# Patient Record
Sex: Female | Born: 1965 | Race: White | Hispanic: No | Marital: Married | Smoking: Current every day smoker
Health system: Southern US, Community
[De-identification: ages and names within clinical notes are randomized; demographics above are authoritative.]

## PROBLEM LIST (undated history)

## (undated) DIAGNOSIS — J449 Chronic obstructive pulmonary disease, unspecified: Secondary | ICD-10-CM

## (undated) DIAGNOSIS — F419 Anxiety disorder, unspecified: Secondary | ICD-10-CM

## (undated) DIAGNOSIS — L400 Psoriasis vulgaris: Secondary | ICD-10-CM

## (undated) DIAGNOSIS — L4050 Arthropathic psoriasis, unspecified: Secondary | ICD-10-CM

## (undated) DIAGNOSIS — F988 Other specified behavioral and emotional disorders with onset usually occurring in childhood and adolescence: Secondary | ICD-10-CM

## (undated) DIAGNOSIS — I10 Essential (primary) hypertension: Secondary | ICD-10-CM

## (undated) DIAGNOSIS — A0472 Enterocolitis due to Clostridium difficile, not specified as recurrent: Secondary | ICD-10-CM

## (undated) DIAGNOSIS — K219 Gastro-esophageal reflux disease without esophagitis: Secondary | ICD-10-CM

## (undated) HISTORY — PX: CERVICAL FUSION: SHX112

## (undated) HISTORY — DX: Arthropathic psoriasis, unspecified: L40.50

## (undated) HISTORY — PX: TUBAL LIGATION: SHX77

## (undated) HISTORY — PX: ELBOW ARTHROSCOPY WITH TENDON RECONSTRUCTION: SHX5616

## (undated) HISTORY — DX: Essential (primary) hypertension: I10

## (undated) HISTORY — PX: TRIGGER FINGER RELEASE: SHX641

## (undated) HISTORY — DX: Enterocolitis due to Clostridium difficile, not specified as recurrent: A04.72

## (undated) HISTORY — DX: Gastro-esophageal reflux disease without esophagitis: K21.9

## (undated) HISTORY — DX: Psoriasis vulgaris: L40.0

---

## 2000-03-20 ENCOUNTER — Encounter: Payer: Self-pay | Admitting: *Deleted

## 2000-03-20 ENCOUNTER — Encounter: Admission: RE | Admit: 2000-03-20 | Discharge: 2000-03-20 | Payer: Self-pay | Admitting: *Deleted

## 2000-07-10 ENCOUNTER — Ambulatory Visit (HOSPITAL_BASED_OUTPATIENT_CLINIC_OR_DEPARTMENT_OTHER): Admission: RE | Admit: 2000-07-10 | Discharge: 2000-07-10 | Payer: Self-pay | Admitting: Orthopedic Surgery

## 2000-10-17 ENCOUNTER — Other Ambulatory Visit: Admission: RE | Admit: 2000-10-17 | Discharge: 2000-10-17 | Payer: Self-pay | Admitting: Family Medicine

## 2001-03-26 ENCOUNTER — Ambulatory Visit (HOSPITAL_BASED_OUTPATIENT_CLINIC_OR_DEPARTMENT_OTHER): Admission: RE | Admit: 2001-03-26 | Discharge: 2001-03-26 | Payer: Self-pay | Admitting: Orthopedic Surgery

## 2003-04-05 ENCOUNTER — Other Ambulatory Visit: Admission: RE | Admit: 2003-04-05 | Discharge: 2003-04-05 | Payer: Self-pay | Admitting: Obstetrics & Gynecology

## 2012-06-11 ENCOUNTER — Encounter (INDEPENDENT_AMBULATORY_CARE_PROVIDER_SITE_OTHER): Payer: Self-pay

## 2013-03-15 ENCOUNTER — Emergency Department (HOSPITAL_COMMUNITY): Payer: Medicare Other

## 2013-03-15 ENCOUNTER — Inpatient Hospital Stay (HOSPITAL_COMMUNITY)
Admission: EM | Admit: 2013-03-15 | Discharge: 2013-03-17 | DRG: 372 | Disposition: A | Discharge status: Home / Self Care | Payer: Medicare Other | Attending: Internal Medicine | Admitting: Internal Medicine

## 2013-03-15 ENCOUNTER — Encounter (HOSPITAL_COMMUNITY): Payer: Self-pay | Admitting: Emergency Medicine

## 2013-03-15 DIAGNOSIS — F17200 Nicotine dependence, unspecified, uncomplicated: Secondary | ICD-10-CM | POA: Diagnosis present

## 2013-03-15 DIAGNOSIS — IMO0002 Reserved for concepts with insufficient information to code with codable children: Secondary | ICD-10-CM

## 2013-03-15 DIAGNOSIS — A0472 Enterocolitis due to Clostridium difficile, not specified as recurrent: Principal | ICD-10-CM | POA: Diagnosis present

## 2013-03-15 DIAGNOSIS — D72829 Elevated white blood cell count, unspecified: Secondary | ICD-10-CM | POA: Diagnosis present

## 2013-03-15 DIAGNOSIS — F1920 Other psychoactive substance dependence, uncomplicated: Secondary | ICD-10-CM | POA: Diagnosis present

## 2013-03-15 DIAGNOSIS — F411 Generalized anxiety disorder: Secondary | ICD-10-CM | POA: Diagnosis present

## 2013-03-15 DIAGNOSIS — F410 Panic disorder [episodic paroxysmal anxiety] without agoraphobia: Secondary | ICD-10-CM | POA: Diagnosis present

## 2013-03-15 DIAGNOSIS — K529 Noninfective gastroenteritis and colitis, unspecified: Secondary | ICD-10-CM

## 2013-03-15 DIAGNOSIS — E440 Moderate protein-calorie malnutrition: Secondary | ICD-10-CM | POA: Diagnosis present

## 2013-03-15 DIAGNOSIS — J4489 Other specified chronic obstructive pulmonary disease: Secondary | ICD-10-CM | POA: Diagnosis present

## 2013-03-15 DIAGNOSIS — Z79899 Other long term (current) drug therapy: Secondary | ICD-10-CM

## 2013-03-15 DIAGNOSIS — J449 Chronic obstructive pulmonary disease, unspecified: Secondary | ICD-10-CM | POA: Diagnosis present

## 2013-03-15 DIAGNOSIS — D509 Iron deficiency anemia, unspecified: Secondary | ICD-10-CM | POA: Diagnosis present

## 2013-03-15 DIAGNOSIS — Z8249 Family history of ischemic heart disease and other diseases of the circulatory system: Secondary | ICD-10-CM

## 2013-03-15 HISTORY — DX: Anxiety disorder, unspecified: F41.9

## 2013-03-15 HISTORY — DX: Other specified behavioral and emotional disorders with onset usually occurring in childhood and adolescence: F98.8

## 2013-03-15 HISTORY — DX: Chronic obstructive pulmonary disease, unspecified: J44.9

## 2013-03-15 LAB — COMPREHENSIVE METABOLIC PANEL
ALT: 14 U/L (ref 0–35)
AST: 21 U/L (ref 0–37)
Albumin: 3.3 g/dL — ABNORMAL LOW (ref 3.5–5.2)
Alkaline Phosphatase: 72 U/L (ref 39–117)
BUN: 10 mg/dL (ref 6–23)
CO2: 22 mEq/L (ref 19–32)
Calcium: 8.7 mg/dL (ref 8.4–10.5)
Chloride: 101 mEq/L (ref 96–112)
Creatinine, Ser: 0.72 mg/dL (ref 0.50–1.10)
GFR calc Af Amer: >90 mL/min (ref >90)
GFR calc non Af Amer: >90 mL/min (ref >90)
Glucose, Bld: 103 mg/dL — ABNORMAL HIGH (ref 70–99)
Potassium: 3.8 mEq/L (ref 3.5–5.1)
Sodium: 135 mEq/L (ref 135–145)
Total Bilirubin: 0.4 mg/dL (ref 0.3–1.2)
Total Protein: 7.1 g/dL (ref 6.0–8.3)

## 2013-03-15 LAB — URINALYSIS, ROUTINE W REFLEX MICROSCOPIC
Bilirubin Urine: NEGATIVE
Glucose, UA: NEGATIVE mg/dL
Hgb urine dipstick: NEGATIVE
Ketones, ur: 40 mg/dL — AB
Leukocytes, UA: NEGATIVE
Nitrite: NEGATIVE
Protein, ur: NEGATIVE mg/dL
Specific Gravity, Urine: 1.007 (ref 1.005–1.030)
Urobilinogen, UA: 0.2 mg/dL (ref 0.0–1.0)
pH: 5.5 (ref 5.0–8.0)

## 2013-03-15 LAB — CBC WITH DIFFERENTIAL/PLATELET
Basophils Absolute: 0 10*3/uL (ref 0.0–0.1)
Basophils Relative: 0 % (ref 0–1)
Eosinophils Absolute: 0 10*3/uL (ref 0.0–0.7)
Eosinophils Relative: 0 % (ref 0–5)
HCT: 44.6 % (ref 36.0–46.0)
Hemoglobin: 15.9 g/dL — ABNORMAL HIGH (ref 12.0–15.0)
Lymphocytes Relative: 6 % — ABNORMAL LOW (ref 12–46)
Lymphs Abs: 1 10*3/uL (ref 0.7–4.0)
MCH: 31.2 pg (ref 26.0–34.0)
MCHC: 35.7 g/dL (ref 30.0–36.0)
MCV: 87.5 fL (ref 78.0–100.0)
Monocytes Absolute: 0.9 10*3/uL (ref 0.1–1.0)
Monocytes Relative: 6 % (ref 3–12)
Neutro Abs: 13.8 10*3/uL — ABNORMAL HIGH (ref 1.7–7.7)
Neutrophils Relative %: 88 % — ABNORMAL HIGH (ref 43–77)
Platelets: 195 10*3/uL (ref 150–400)
RBC: 5.1 MIL/uL (ref 3.87–5.11)
RDW: 13.3 % (ref 11.5–15.5)
WBC: 15.7 10*3/uL — ABNORMAL HIGH (ref 4.0–10.5)

## 2013-03-15 LAB — PREGNANCY, URINE: Preg Test, Ur: NEGATIVE

## 2013-03-15 LAB — LIPASE, BLOOD: Lipase: 10 U/L — ABNORMAL LOW (ref 11–59)

## 2013-03-15 MED ORDER — MORPHINE SULFATE 4 MG/ML IJ SOLN
4.0000 mg | Freq: Once | INTRAMUSCULAR | Status: AC
  Administered 2013-03-15: 4 mg via INTRAVENOUS
  Filled 2013-03-15: qty 1

## 2013-03-15 MED ORDER — METRONIDAZOLE IN NACL 5-0.79 MG/ML-% IV SOLN
500.0000 mg | Freq: Once | INTRAVENOUS | Status: AC
  Administered 2013-03-16: 500 mg via INTRAVENOUS
  Filled 2013-03-15: qty 100

## 2013-03-15 MED ORDER — ONDANSETRON HCL 4 MG/2ML IJ SOLN
4.0000 mg | Freq: Once | INTRAMUSCULAR | Status: AC
  Administered 2013-03-15: 4 mg via INTRAVENOUS
  Filled 2013-03-15: qty 2

## 2013-03-15 MED ORDER — SODIUM CHLORIDE 0.9 % IV BOLUS (SEPSIS)
1000.0000 mL | Freq: Once | INTRAVENOUS | Status: AC
  Administered 2013-03-16: 1000 mL via INTRAVENOUS

## 2013-03-15 MED ORDER — IOHEXOL 300 MG/ML  SOLN
100.0000 mL | Freq: Once | INTRAMUSCULAR | Status: AC | PRN
  Administered 2013-03-15: 100 mL via INTRAVENOUS

## 2013-03-15 NOTE — ED Notes (Signed)
Pt's husband came to desk to discuss wait time.  He stated that he would like a way to contact customer service because of thought since his wife's PCP called and talked to staff the wait would be less.  Explanation given that the staff was working as fast as possible to get her back and that she would be seen as soon as possible.

## 2013-03-15 NOTE — ED Notes (Signed)
MD at bedside. 

## 2013-03-15 NOTE — ED Provider Notes (Signed)
CSN: [id number]     Arrival date & time 03/15/13  1557 History   First MD Initiated Contact with Patient 03/15/13 2018     Chief Complaint  Patient presents with  . Abdominal Pain  . Diarrhea   (Consider location/radiation/quality/duration/timing/severity/associated sxs/prior Treatment) Patient is a 47 y.o. female presenting with abdominal pain and diarrhea. The history is provided by the patient.  Abdominal Pain Associated symptoms: diarrhea   Associated symptoms: no chest pain, no nausea, no shortness of breath and no vomiting   Diarrhea Associated symptoms: abdominal pain   Associated symptoms: no headaches and no vomiting    patient presents with lower abdominal pain. She was sent in by an urgent care. She states she's had diarrhea for a couple months. It began after using antibiotics for dental surgery. No fevers. She states the diarrhea is like water. No fevers. She states sometimes she has some difficulty urinating. No dysuria.  the pain is dull and crampy. She states that she has lost weight. She has lost close to 30 pounds.  Past Medical History  Diagnosis Date  . COPD (chronic obstructive pulmonary disease)   . ADD (attention deficit disorder)   . Anxiety disorder    Past Surgical History  Procedure Laterality Date  . Hand tendon surgery    . Cesarean section    . Back surgery    . Tubal ligation     Family History  Problem Relation Age of Onset  . CAD Father   . Throat cancer Other    History  Substance Use Topics  . Smoking status: Current Every Day Smoker -- 0.50 packs/day for 37 years    Types: Cigarettes  . Smokeless tobacco: Not on file  . Alcohol Use: Yes     Comment: occ   OB History   Grav Para Term Preterm Abortions TAB SAB Ect Mult Living                 Review of Systems  Constitutional: Positive for appetite change. Negative for activity change.  Eyes: Negative for pain.  Respiratory: Negative for chest tightness and shortness of breath.    Cardiovascular: Negative for chest pain and leg swelling.  Gastrointestinal: Positive for abdominal pain and diarrhea. Negative for nausea and vomiting.  Genitourinary: Negative for flank pain.  Musculoskeletal: Negative for back pain and neck stiffness.  Skin: Negative for rash.  Neurological: Negative for weakness, numbness and headaches.  Psychiatric/Behavioral: Negative for behavioral problems.    Allergies  Review of patient's allergies indicates no known allergies.  Home Medications   Current Outpatient Rx  Name  Route  Sig  Dispense  Refill  . ALPRAZolam (XANAX) 1 MG tablet   Oral   Take 1 mg by mouth 4 (four) times daily as needed for anxiety (panic attacks).         Marland Kitchen amphetamine-dextroamphetamine (ADDERALL) 20 MG tablet   Oral   Take 20 mg by mouth 3 (three) times daily.         . ClonazePAM (KLONOPIN PO)   Oral   Take 1 tablet by mouth once.         . diphenhydrAMINE (BENADRYL) 25 MG tablet   Oral   Take 25 mg by mouth daily as needed for allergies.         . Fe Fum-FA-B Cmp-C-Zn-Mg-Mn-Cu (FERROCITE PLUS) 106-1 MG TABS   Oral   Take 1 tablet by mouth daily.         Marland Kitchen ibuprofen (  ADVIL,MOTRIN) 800 MG tablet   Oral   Take 800 mg by mouth every 8 (eight) hours as needed (pain).         Marland Kitchen oxyCODONE-acetaminophen (PERCOCET) 10-325 MG per tablet   Oral   Take 1 tablet by mouth every 4 (four) hours as needed for pain.         . traMADol (ULTRAM) 50 MG tablet   Oral   Take 50 mg by mouth every 8 (eight) hours as needed (pain).         . triamcinolone cream (KENALOG) 0.1 %   Topical   Apply 1 application topically daily as needed (itching).         . zolpidem (AMBIEN) 10 MG tablet   Oral   Take 10 mg by mouth at bedtime as needed for sleep.         . metroNIDAZOLE (FLAGYL) 500 MG tablet   Oral   Take 1 tablet (500 mg total) by mouth every 8 (eight) hours.   39 tablet   0   . ondansetron (ZOFRAN) 4 MG tablet   Oral   Take 1 tablet  (4 mg total) by mouth daily as needed for nausea or vomiting.   20 tablet   0    BP 125/67  Pulse 98  Temp(Src) 98.2 F (36.8 C) (Oral)  Resp 21  Ht 5\' 1"  (1.549 m)  Wt 112 lb 3.2 oz (50.894 kg)  BMI 21.21 kg/m2  SpO2 97%  LMP 02/15/2013 Physical Exam  Nursing note and vitals reviewed. Constitutional: She is oriented to person, place, and time. She appears well-developed and well-nourished.  HENT:  Head: Normocephalic and atraumatic.  Eyes: EOM are normal. Pupils are equal, round, and reactive to light.  Neck: Normal range of motion. Neck supple.  Cardiovascular: Normal rate, regular rhythm and normal heart sounds.   No murmur heard. Pulmonary/Chest: Effort normal and breath sounds normal. No respiratory distress. She has no wheezes. She has no rales.  Abdominal: Soft. Bowel sounds are normal. She exhibits no distension. There is tenderness. There is no rebound and no guarding.  Mild diffuse tenderness, worse in the right lower quadrant. No rebound or guarding.  Musculoskeletal: Normal range of motion.  Neurological: She is alert and oriented to person, place, and time. No cranial nerve deficit.  Skin: Skin is warm and dry.  Psychiatric: She has a normal mood and affect. Her speech is normal.    ED Course  Procedures (including critical care time) Labs Review Labs Reviewed  CLOSTRIDIUM DIFFICILE BY PCR - Abnormal; Notable for the following:    C difficile by pcr POSITIVE (*)    All other components within normal limits  CBC WITH DIFFERENTIAL - Abnormal; Notable for the following:    WBC 15.7 (*)    Hemoglobin 15.9 (*)    Neutrophils Relative % 88 (*)    Neutro Abs 13.8 (*)    Lymphocytes Relative 6 (*)    All other components within normal limits  COMPREHENSIVE METABOLIC PANEL - Abnormal; Notable for the following:    Glucose, Bld 103 (*)    Albumin 3.3 (*)    All other components within normal limits  URINALYSIS, ROUTINE W REFLEX MICROSCOPIC - Abnormal; Notable for  the following:    APPearance CLOUDY (*)    Ketones, ur 40 (*)    All other components within normal limits  LIPASE, BLOOD - Abnormal; Notable for the following:    Lipase 10 (*)  All other components within normal limits  BASIC METABOLIC PANEL - Abnormal; Notable for the following:    Calcium 8.1 (*)    All other components within normal limits  CBC - Abnormal; Notable for the following:    WBC 12.1 (*)    All other components within normal limits  STOOL CULTURE  GI PATHOGEN PANEL BY PCR, STOOL  PREGNANCY, URINE   Imaging Review No results found.  EKG Interpretation     Ventricular Rate:    PR Interval:    QRS Duration:   QT Interval:    QTC Calculation:   R Axis:     Text Interpretation:              MDM   1. Colitis   2. Anxiety state, unspecified    Patient with colitis. Has had diarrhea with weight loss. CT scan shows colitis. Has been on antibiotics. Will admit.    Juliet Rude. Rubin Payor, MD 03/18/13 [phone]

## 2013-03-15 NOTE — ED Notes (Signed)
Pt sent here by [facility] for eval of generalized abd pain with diarrhea and nausea

## 2013-03-15 NOTE — ED Notes (Signed)
Bedside commode at [facility] for a stool sample.

## 2013-03-15 NOTE — ED Notes (Signed)
CT called and informed the pt has finished her contrast drink.

## 2013-03-15 NOTE — ED Notes (Signed)
Pt denies any signs of fever. Pt states she was seen at urgent care and was sent here to be further evaluated.

## 2013-03-16 ENCOUNTER — Encounter (HOSPITAL_COMMUNITY): Payer: Self-pay | Admitting: Internal Medicine

## 2013-03-16 DIAGNOSIS — K5289 Other specified noninfective gastroenteritis and colitis: Secondary | ICD-10-CM

## 2013-03-16 DIAGNOSIS — E440 Moderate protein-calorie malnutrition: Secondary | ICD-10-CM | POA: Insufficient documentation

## 2013-03-16 DIAGNOSIS — K529 Noninfective gastroenteritis and colitis, unspecified: Secondary | ICD-10-CM

## 2013-03-16 LAB — GI PATHOGEN PANEL BY PCR, STOOL
C difficile toxin A/B: NEGATIVE
Cryptosporidium by PCR: NEGATIVE
E coli (ETEC) LT/ST: NEGATIVE
E coli (STEC): NEGATIVE
E coli 0157 by PCR: NEGATIVE
Shigella by PCR: NEGATIVE

## 2013-03-16 LAB — CBC
Hemoglobin: 12.5 g/dL (ref 12.0–15.0)
MCHC: 34 g/dL (ref 30.0–36.0)
RBC: 4.17 MIL/uL (ref 3.87–5.11)
WBC: 12.1 10*3/uL — ABNORMAL HIGH (ref 4.0–10.5)

## 2013-03-16 LAB — BASIC METABOLIC PANEL
BUN: 7 mg/dL (ref 6–23)
GFR calc Af Amer: >90 mL/min (ref >90)
GFR calc non Af Amer: >90 mL/min (ref >90)
Potassium: 3.5 mEq/L (ref 3.5–5.1)
Sodium: 137 mEq/L (ref 135–145)

## 2013-03-16 MED ORDER — MORPHINE SULFATE 2 MG/ML IJ SOLN
2.0000 mg | INTRAMUSCULAR | Status: DC | PRN
  Administered 2013-03-16: 2 mg via INTRAVENOUS
  Filled 2013-03-16: qty 1

## 2013-03-16 MED ORDER — METRONIDAZOLE 500 MG PO TABS
500.0000 mg | ORAL_TABLET | Freq: Three times a day (TID) | ORAL | Status: DC
  Administered 2013-03-16 – 2013-03-17 (×4): 500 mg via ORAL
  Filled 2013-03-16 (×6): qty 1

## 2013-03-16 MED ORDER — OXYCODONE-ACETAMINOPHEN 5-325 MG PO TABS
1.0000 | ORAL_TABLET | ORAL | Status: DC | PRN
  Administered 2013-03-16 (×2): 1 via ORAL
  Filled 2013-03-16 (×2): qty 1

## 2013-03-16 MED ORDER — ALPRAZOLAM 0.5 MG PO TABS
1.0000 mg | ORAL_TABLET | Freq: Four times a day (QID) | ORAL | Status: DC | PRN
  Administered 2013-03-16 – 2013-03-17 (×5): 1 mg via ORAL
  Filled 2013-03-16 (×5): qty 2

## 2013-03-16 MED ORDER — ONDANSETRON HCL 4 MG/2ML IJ SOLN
4.0000 mg | Freq: Four times a day (QID) | INTRAMUSCULAR | Status: DC | PRN
  Filled 2013-03-16: qty 2

## 2013-03-16 MED ORDER — MORPHINE SULFATE 2 MG/ML IJ SOLN: 2.0000 mg | INTRAMUSCULAR | Status: DC | PRN

## 2013-03-16 MED ORDER — OXYCODONE HCL 5 MG PO TABS
5.0000 mg | ORAL_TABLET | ORAL | Status: DC
  Administered 2013-03-16: 5 mg via ORAL
  Filled 2013-03-16: qty 1

## 2013-03-16 MED ORDER — OXYCODONE-ACETAMINOPHEN 10-325 MG PO TABS: 1.0000 | ORAL_TABLET | ORAL | Status: DC | PRN

## 2013-03-16 MED ORDER — SODIUM CHLORIDE 0.9 % IV SOLN
INTRAVENOUS | Status: AC
  Administered 2013-03-16 – 2013-03-17 (×3): via INTRAVENOUS

## 2013-03-16 MED ORDER — ZOLPIDEM TARTRATE 5 MG PO TABS
5.0000 mg | ORAL_TABLET | Freq: Every evening | ORAL | Status: DC | PRN
  Administered 2013-03-16 (×2): 5 mg via ORAL
  Filled 2013-03-16 (×2): qty 1

## 2013-03-16 MED ORDER — OXYCODONE HCL 5 MG PO TABS
5.0000 mg | ORAL_TABLET | ORAL | Status: DC | PRN
  Administered 2013-03-16 (×2): 5 mg via ORAL
  Filled 2013-03-16 (×2): qty 1

## 2013-03-16 MED ORDER — OXYCODONE-ACETAMINOPHEN 5-325 MG PO TABS
1.0000 | ORAL_TABLET | ORAL | Status: DC | PRN
  Administered 2013-03-16 – 2013-03-17 (×5): 1 via ORAL
  Filled 2013-03-16 (×4): qty 1

## 2013-03-16 MED ORDER — MORPHINE SULFATE 2 MG/ML IJ SOLN
1.0000 mg | INTRAMUSCULAR | Status: DC | PRN
  Administered 2013-03-16: 1 mg via INTRAVENOUS
  Filled 2013-03-16: qty 1

## 2013-03-16 MED ORDER — OXYCODONE HCL 5 MG PO TABS
5.0000 mg | ORAL_TABLET | Freq: Once | ORAL | Status: AC
  Administered 2013-03-16: 5 mg via ORAL
  Filled 2013-03-16: qty 1

## 2013-03-16 MED ORDER — MORPHINE SULFATE 4 MG/ML IJ SOLN
4.0000 mg | Freq: Once | INTRAMUSCULAR | Status: AC
  Administered 2013-03-16: 4 mg via INTRAVENOUS
  Filled 2013-03-16: qty 1

## 2013-03-16 MED ORDER — AMPHETAMINE-DEXTROAMPHETAMINE 10 MG PO TABS
20.0000 mg | ORAL_TABLET | Freq: Three times a day (TID) | ORAL | Status: DC
  Administered 2013-03-16 – 2013-03-17 (×3): 20 mg via ORAL
  Filled 2013-03-16 (×3): qty 2

## 2013-03-16 MED ORDER — OXYCODONE HCL 5 MG PO TABS
5.0000 mg | ORAL_TABLET | ORAL | Status: DC | PRN
  Administered 2013-03-16 – 2013-03-17 (×5): 5 mg via ORAL
  Filled 2013-03-16 (×5): qty 1

## 2013-03-16 MED ORDER — METRONIDAZOLE IN NACL 5-0.79 MG/ML-% IV SOLN
500.0000 mg | Freq: Three times a day (TID) | INTRAVENOUS | Status: DC
  Administered 2013-03-16: 500 mg via INTRAVENOUS
  Filled 2013-03-16 (×2): qty 100

## 2013-03-16 MED ORDER — AMPHETAMINE-DEXTROAMPHETAMINE 20 MG PO TABS: 20.0000 mg | ORAL_TABLET | Freq: Three times a day (TID) | ORAL | Status: DC

## 2013-03-16 MED ORDER — DICYCLOMINE HCL 10 MG/ML IM SOLN
20.0000 mg | Freq: Once | INTRAMUSCULAR | Status: DC
  Filled 2013-03-16: qty 2

## 2013-03-16 MED ORDER — ACETAMINOPHEN 650 MG RE SUPP: 650.0000 mg | Freq: Four times a day (QID) | RECTAL | Status: DC | PRN

## 2013-03-16 MED ORDER — OXYCODONE-ACETAMINOPHEN 5-325 MG PO TABS
1.0000 | ORAL_TABLET | ORAL | Status: DC
  Administered 2013-03-16: 1 via ORAL
  Filled 2013-03-16: qty 1

## 2013-03-16 MED ORDER — DICYCLOMINE HCL 20 MG PO TABS
20.0000 mg | ORAL_TABLET | Freq: Once | ORAL | Status: AC
  Administered 2013-03-16: 20 mg via ORAL
  Filled 2013-03-16: qty 1

## 2013-03-16 MED ORDER — ONDANSETRON HCL 4 MG PO TABS
4.0000 mg | ORAL_TABLET | Freq: Four times a day (QID) | ORAL | Status: DC | PRN
  Administered 2013-03-16 – 2013-03-17 (×3): 4 mg via ORAL
  Filled 2013-03-16 (×3): qty 1

## 2013-03-16 MED ORDER — ACETAMINOPHEN 325 MG PO TABS: 650.0000 mg | ORAL_TABLET | Freq: Four times a day (QID) | ORAL | Status: DC | PRN

## 2013-03-16 MED ORDER — FERROUS FUMARATE 325 (106 FE) MG PO TABS
1.0000 | ORAL_TABLET | Freq: Every day | ORAL | Status: DC
  Administered 2013-03-16 – 2013-03-17 (×2): 106 mg via ORAL
  Filled 2013-03-16 (×3): qty 1

## 2013-03-16 NOTE — Progress Notes (Addendum)
[facility] PROGRESS NOTE   Ann Ramsey UUV:[id number] DOB: 1966/02/07 DOA: 03/15/2013 PCP: Salvadore Dom, MD  Assessment/Plan  C. Diff colitis, borderline WBC count for severe, but already clinically improving on empiric flagyl.  Tolerating some food and drink -  Change to oral flagyl TID -  Advance diet  Narcotic dependence due to surgery many years ago, pain score down to 2 -  Tolerating diet so continue oral narcotics -  D/c IV pain medication as pain improving  Anxiety disorder with panic attacks.  States she has never been on controlled medications and only has been on xanax previously.  States her anxiety is completely unrelated to her adderall use.    ADD:  States she needs the adderall to help her focus.    COPD, stable.   Leukocytosis, trending down with hydration and antibiotics  Normocytic anemia, likely secondary to marrow suppression from acute illness and iron deficiency.  Repeat by PCP in 2-3 weeks.  Continued iron  This morning when I spoke with the patient we discussed primarily her diarrhea and did not discuss her chronic medical conditions or her medications.  This afternoon, I began trying to identify the medical problems for which she takes chronic narcotics, chronic xanax, and adderall.  I attempted to call her primary care doctor to obtain her full list of medical problems (only one that she mentioned at admission was COPD) and medications with doses, however, the PCP name in the chart was not correct.  I called the patient to obtain her primary care doctor's information, and she clarified that her doctor is Dr. Lerry Liner.  I discussed with her that the aforementioned medications interact with each other and gave her the example that adderall can increase anxiety therefore leading to increased xanax use.  All of these medications are addictive and should be minimized when possible.  I discussed that there are alternatives for anxiety which she  has not tried previously, such as SSRI.  I stated that I would like to verify her medication doses and discuss these medications with her primary care doctor.  I told her that I would not change her doses at this time but that she should continue to talk to her doctor about them and minimize them when possible.  The patient stated that she felt that it was not my business how her chronic medical problems were being managed.  I explained that I was not planning to change her medications, just verify her doses and discuss the drug-drug interactions with her primary care doctor.  She stated that she did not want me talking to her primary care doctor about her chronic problems or medications, that that would be a HIPAA violation.  I stated that the primary care doctor already knew she was on these medications and that discussing them would not be releasing information, however, if she were adamant, I could verify her doses with her pharmacy.  Simultaneously, I looked in the computer system to see if she had listed a pharmacy when she was admitted and there was none listed.  She declined to give me her pharmacy information.  She asked that I speak with her husband.  Her husband stated that he also thought it was inappropriate that I try to speak with her primary care doctor against her wishes and that he would like to speak to the patient advocate about me questioning the patient's chronic medications.  There was no resolution to the issue and we hung up.  I called risk management to determine whether I was able to contact the patient's primary care doctor or pharmacy without her permission.  They deferred to the privacy officer, Arnoldo Morale.  [phone].  I explained the situation and they reviewed the information, however, they stated that to their knowledge, unless the patient was in imminent danger, I was not allowed to contact her primary care doctor without her consent.  They are going to investigate this issue  further.  Previously, it was my understanding that information could be released for ongoing patient care between our two groups without patient consent, however, this case is different in that the patient explicitly stated that she did not want the information shared or released.  Risk management/Privacy Office to contact me with their final determination  I have ordered pharmacy to try to verify the patient's medication list.    Diet:  regular Access:  PIV IVF:  yes Proph:  SCDs  Code Status: full code Family Communication: spoke with patient and her husband Disposition Plan: home tomorrow if staying hydrated and diarrhea resolving   Consultants:  None  Procedures:  CT abd/pelvis  Antibiotics:  Flagyl 11/11 >>   HPI/Subjective:  Only two loose BMs this morning at the time of my interview.  Continues to have some abdominal cramping.  Up to the bathroom without difficulty.  Able to eat some breakfast.    Objective: Filed Vitals:   03/16/13 0156 03/16/13 0513 03/16/13 0945 03/16/13 1409  BP: 117/62 100/58 106/75 124/61  Pulse: 96 94 95 82  Temp: 98.3 F (36.8 C) 98 F (36.7 C) 98.1 F (36.7 C) 98.1 F (36.7 C)  TempSrc: Oral Oral Oral Oral  Resp: 15 16 17 20   Height:      Weight:      SpO2: 100% 100% 94% 96%    Intake/Output Summary (Last 24 hours) at 03/16/13 1556 Last data filed at 03/16/13 0600  Gross per 24 hour  Intake    699 ml  Output      0 ml  Net    699 ml   Filed Weights   03/15/13 1613  Weight: 50.894 kg (112 lb 3.2 oz)    Exam:   General:  Thin CF, No acute distress  HEENT:  NCAT, MMM  Cardiovascular:  RRR, nl S1, S2 no mrg, 2+ pulses, warm extremities  Respiratory:  CTAB, no increased WOB  Abdomen:   Hyperactive BS, soft, ND, mildly TTP diffusely wtihout rebound or guarding  MSK:   Normal tone and bulk, no LEE  Neuro:  Grossly intact  Data Reviewed: Basic Metabolic Panel:  Recent Labs Lab 03/15/13 1615 03/16/13 0535  NA  135 137  K 3.8 3.5  CL 101 104  CO2 22 22  GLUCOSE 103* 75  BUN 10 7  CREATININE 0.72 0.62  CALCIUM 8.7 8.1*   Liver Function Tests:  Recent Labs Lab 03/15/13 1615  AST 21  ALT 14  ALKPHOS 72  BILITOT 0.4  PROT 7.1  ALBUMIN 3.3*    Recent Labs Lab 03/15/13 1615  LIPASE 10*   No results found for this basename: AMMONIA,  in the last 168 hours CBC:  Recent Labs Lab 03/15/13 1615 03/16/13 0535  WBC 15.7* 12.1*  NEUTROABS 13.8*  --   HGB 15.9* 12.5  HCT 44.6 36.8  MCV 87.5 88.2  PLT 195 166   Cardiac Enzymes: No results found for this basename: CKTOTAL, CKMB, CKMBINDEX, TROPONINI,  in the last 168 hours BNP (  last 3 results) No results found for this basename: PROBNP,  in the last 8760 hours CBG: No results found for this basename: GLUCAP,  in the last 168 hours  Recent Results (from the past 240 hour(s))  CLOSTRIDIUM DIFFICILE BY PCR     Status: Abnormal   Collection Time    03/16/13  5:52 AM      Result Value Range Status   C difficile by pcr POSITIVE (*) NEGATIVE Final   Comment: CRITICAL RESULT CALLED TO, READ BACK BY AND VERIFIED WITH:     C HUCKABEE,RN 03/16/13 0908 BY K SCHULTZ     Studies: Ct Abdomen Pelvis W Contrast  03/15/2013   CLINICAL DATA:  Generalized abdominal pain, diarrhea and nausea.  EXAM: CT ABDOMEN AND PELVIS WITH CONTRAST  TECHNIQUE: Multidetector CT imaging of the abdomen and pelvis was performed using the standard protocol following bolus administration of intravenous contrast.  CONTRAST:  OMNIPAQUE IOHEXOL 300 MG/ML  SOLN  COMPARISON:  Abdominal ultrasound performed 03/01/2005  FINDINGS: The visualized lung bases are clear.  The liver and spleen are unremarkable in appearance. The gallbladder is within normal limits. The pancreas and adrenal glands are unremarkable.  The kidneys are unremarkable in appearance. There is no evidence of hydronephrosis. No renal or ureteral stones are seen. No perinephric stranding is appreciated.   The small bowel is unremarkable in appearance. The stomach is within normal limits. No acute vascular abnormalities are seen. Scattered calcification is noted along the abdominal aorta and its branches. Incidental note is made of a retroaortic left renal vein.  The appendix is normal in caliber, without evidence for appendicitis.  There is diffuse wall thickening involving the entirety of the colon and rectum, with surrounding soft tissue inflammation. Findings are compatible with diffuse colitis, either infectious or inflammatory in nature. Contrast progresses to the level of the proximal sigmoid colon. No significant free fluid is seen.  The bladder is mildly distended and grossly unremarkable in appearance. The uterus is within normal limits. Scattered Nabothian cysts are seen. The ovaries are relatively symmetric; no suspicious adnexal masses are identified. No inguinal lymphadenopathy is seen.  No acute osseous abnormalities are identified.  IMPRESSION: 1. Diffuse wall thickening involving the entirety of the colon and rectum, with surrounding soft tissue inflammation. Findings compatible with diffuse colitis, either infectious or inflammatory in nature. 2. Scattered calcification along the abdominal aorta and its branches.   Electronically Signed   By: Roanna Raider M.D.   On: 03/15/2013 23:18    Scheduled Meds: . amphetamine-dextroamphetamine  20 mg Oral TID WC  . ferrous fumarate  1 tablet Oral Daily  . metroNIDAZOLE  500 mg Oral Q8H  . oxyCODONE-acetaminophen  1 tablet Oral Q4H while awake   And  . oxyCODONE  5 mg Oral Q4H while awake   Continuous Infusions: . sodium chloride 125 mL/hr at 03/16/13 1256    Principal Problem:   Colitis Active Problems:   Malnutrition of moderate degree    Time spent: 30 min    Johnatan Baskette, [facility]  [facility] Pager [phone]. If 7PM-7AM, please contact night-coverage at [email or web address], password [facility] 03/16/2013, 3:56 PM  LOS: 1 day

## 2013-03-16 NOTE — ED Notes (Signed)
Pt given a gingerale

## 2013-03-16 NOTE — Progress Notes (Signed)
INITIAL NUTRITION ASSESSMENT  DOCUMENTATION CODES Per approved criteria  -Non-severe (moderate) malnutrition in the context of acute illness or injury   INTERVENTION: 1.  General healthful diet; encourage intake as able.  Discussed food tolerance with pt and ways to improve intake.  Provided with snack from unit.  Encouraged pt to request snacks as needed. 2.  Recommend re-weigh once able.   NUTRITION DIAGNOSIS: Inadequate oral intake related to altered GI function; diarrhea, abdominal pain as evidenced by PO <25% of meals, wt loss.   Monitor:  1.  Food/Beverage; pt meeting >/=90% estimated needs with tolerance. 2.  Wt/wt change; monitor trends  Reason for Assessment: MST  47 y.o. female  Admitting Dx: Colitis  ASSESSMENT: Pt admitted with several days h/o diarrhea.  Pt with recent illness and abx use, now with C. Diff colitis.   RD met with pt who repots wt loss from 146 lbs to 112 lbs.   Nutrition Focused Physical Exam:  Subcutaneous Fat:  Orbital Region: WNL Upper Arm Region: WNL, saggy ness noted Thoracic and Lumbar Region: WNL  Muscle:  Temple Region: moderate wasting Clavicle Bone Region: WNL Clavicle and Acromion Bone Region: WNL Scapular Bone Region: mild-moderate wasting Dorsal Hand: mild-moderate wasting Patellar Region: WNL Anterior Thigh Region: WNL Posterior Calf Region: WNL  Edema: none present  Pt meets criteria for non-severe MALNUTRITION in the context of acute as evidenced by reported wt loss and decreased intake not sufficient to meet 75% of estimated needs.  Recommend re-weigh once able.   Height: Ht Readings from Last 1 Encounters:  03/15/13 5\' 1"  (1.549 m)    Weight: Wt Readings from Last 1 Encounters:  03/15/13 112 lb 3.2 oz (50.894 kg)    Ideal Body Weight: 105 lbs  % Ideal Body Weight: 93%  Wt Readings from Last 10 Encounters:  03/15/13 112 lb 3.2 oz (50.894 kg)    Usual Body Weight: 145 lbs per pt, no wt available for  review  % Usual Body Weight: 77%  BMI:  Body mass index is 21.21 kg/(m^2).  Estimated Nutritional Needs: Kcal: 1500-1700 Protein: 60-75g Fluid: >1.5 L/day  Skin: intact  Diet Order: General  EDUCATION NEEDS: -Education needs addressed   Intake/Output Summary (Last 24 hours) at 03/16/13 1502 Last data filed at 03/16/13 0600  Gross per 24 hour  Intake    699 ml  Output      0 ml  Net    699 ml    Last BM: WNL  Labs:   Recent Labs Lab 03/15/13 1615 03/16/13 0535  NA 135 137  K 3.8 3.5  CL 101 104  CO2 22 22  BUN 10 7  CREATININE 0.72 0.62  CALCIUM 8.7 8.1*  GLUCOSE 103* 75    CBG (last 3)  No results found for this basename: GLUCAP,  in the last 72 hours  Scheduled Meds: . amphetamine-dextroamphetamine  20 mg Oral TID WC  . ferrous fumarate  1 tablet Oral Daily  . metroNIDAZOLE  500 mg Oral Q8H  . oxyCODONE-acetaminophen  1 tablet Oral Q4H while awake   And  . oxyCODONE  5 mg Oral Q4H while awake    Continuous Infusions: . sodium chloride 125 mL/hr at 03/16/13 1256    Past Medical History  Diagnosis Date  . COPD (chronic obstructive pulmonary disease)     Past Surgical History  Procedure Laterality Date  . Hand tendon surgery    . Cesarean section    . Back surgery    .  Tubal ligation      Loyce Dys, MS RD LDN Clinical Inpatient Dietitian Pager: [phone] Weekend/After hours pager: [phone]

## 2013-03-16 NOTE — ED Notes (Signed)
Attempted to give report 

## 2013-03-16 NOTE — H&P (Signed)
[facility] History and Physical  TIMIKO OFFUTT NGE:[id number] DOB: [birth year] DOA: 03/15/2013  Referring physician: ER physician. PCP: Salvadore Dom, MD   Chief Complaint: Abdominal pain and diarrhea.  HPI: Ann Ramsey is a 47 y.o. female history of COPD, tobacco abuse has been experiencing severe diarrhea last 2 days with abdominal cramps. Patient had originally gone to urgent care Center and was referred to the ER. In the ER patient had CT abdomen pelvis which shows diffuse colitis. Patient last month during the early part of October had Levaquin for bronchitis and following which patient had a course of clindamycin for tooth abscess. For almost a month patient has been having diarrhea off and on but it is worse in last 2 days with at least 10-15 episodes. Patient's abdominal pain is diffuse crampy with no associated nausea vomiting, fever chills. Patient denies any chest pain or short of breath.   Review of Systems: As presented in the history of presenting illness, rest negative.  Past Medical History  Diagnosis Date  . COPD (chronic obstructive pulmonary disease)    Past Surgical History  Procedure Laterality Date  . Hand tendon surgery    . Cesarean section     Social History:  reports that she has been smoking.  She does not have any smokeless tobacco history on file. She reports that she drinks alcohol. She reports that she does not use illicit drugs. Where does patient live home. Can patient participate in ADLs? Yes.  No Known Allergies  Family History:  Family History  Problem Relation Age of Onset  . CAD Father   . Throat cancer Other       Prior to Admission medications   Medication Sig Start Date End Date Taking? Authorizing Provider  ALPRAZolam Prudy Feeler) 1 MG tablet Take 1 mg by mouth 4 (four) times daily as needed for anxiety (panic attacks).   Yes Historical Provider, MD  amphetamine-dextroamphetamine (ADDERALL) 20 MG tablet Take 20 mg by  mouth 3 (three) times daily.   Yes Historical Provider, MD  Aspirin-Salicylamide-Caffeine (BC HEADACHE PO) Take 1 packet by mouth daily as needed (pain).   Yes Historical Provider, MD  ClonazePAM (KLONOPIN PO) Take 1 tablet by mouth once.   Yes Historical Provider, MD  diphenhydrAMINE (BENADRYL) 25 MG tablet Take 25 mg by mouth daily as needed for allergies.   Yes Historical Provider, MD  Fe Fum-FA-B Cmp-C-Zn-Mg-Mn-Cu (FERROCITE PLUS) 106-1 MG TABS Take 1 tablet by mouth daily.   Yes Historical Provider, MD  ibuprofen (ADVIL,MOTRIN) 800 MG tablet Take 800 mg by mouth every 8 (eight) hours as needed (pain).   Yes Historical Provider, MD  ondansetron (ZOFRAN) 4 MG tablet Take 4 mg by mouth daily as needed for nausea or vomiting.   Yes Historical Provider, MD  oxyCODONE-acetaminophen (PERCOCET) 10-325 MG per tablet Take 1 tablet by mouth every 4 (four) hours as needed for pain.   Yes Historical Provider, MD  traMADol (ULTRAM) 50 MG tablet Take 50 mg by mouth every 8 (eight) hours as needed (pain).   Yes Historical Provider, MD  triamcinolone cream (KENALOG) 0.1 % Apply 1 application topically daily as needed (itching).   Yes Historical Provider, MD  zolpidem (AMBIEN) 10 MG tablet Take 10 mg by mouth at bedtime as needed for sleep.   Yes Historical Provider, MD    Physical Exam: Filed Vitals:   03/16/13 0000 03/16/13 0037 03/16/13 0100 03/16/13 0156  BP: 108/66 117/70 110/63 117/62  Pulse: 97 96 106 96  Temp:    98.3 F (36.8 C)  TempSrc:    Oral  Resp:  16 16 15   Height:      Weight:      SpO2: 96% 98% 98% 100%     General:  Well-developed and nourished.  Eyes: Anicteric no pallor.  ENT: No discharge from the ears eyes nose mouth.  Neck: No mass felt.  Cardiovascular: S1-S2 heard.  Respiratory: No rhonchi or crepitations.  Abdomen: Mild tenderness diffusely no guarding no rigidity soft.  Skin: No rash.  Musculoskeletal: No edema.  Psychiatric: Appears normal.  Neurologic:  Alert awake oriented to time place and person. Moves all extremities.  Labs on Admission:  Basic Metabolic Panel:  Recent Labs Lab 03/15/13 1615  NA 135  K 3.8  CL 101  CO2 22  GLUCOSE 103*  BUN 10  CREATININE 0.72  CALCIUM 8.7   Liver Function Tests:  Recent Labs Lab 03/15/13 1615  AST 21  ALT 14  ALKPHOS 72  BILITOT 0.4  PROT 7.1  ALBUMIN 3.3*    Recent Labs Lab 03/15/13 1615  LIPASE 10*   No results found for this basename: AMMONIA,  in the last 168 hours CBC:  Recent Labs Lab 03/15/13 1615  WBC 15.7*  NEUTROABS 13.8*  HGB 15.9*  HCT 44.6  MCV 87.5  PLT 195   Cardiac Enzymes: No results found for this basename: CKTOTAL, CKMB, CKMBINDEX, TROPONINI,  in the last 168 hours  BNP (last 3 results) No results found for this basename: PROBNP,  in the last 8760 hours CBG: No results found for this basename: GLUCAP,  in the last 168 hours  Radiological Exams on Admission: Ct Abdomen Pelvis W Contrast  03/15/2013   CLINICAL DATA:  Generalized abdominal pain, diarrhea and nausea.  EXAM: CT ABDOMEN AND PELVIS WITH CONTRAST  TECHNIQUE: Multidetector CT imaging of the abdomen and pelvis was performed using the standard protocol following bolus administration of intravenous contrast.  CONTRAST:  OMNIPAQUE IOHEXOL 300 MG/ML  SOLN  COMPARISON:  Abdominal ultrasound performed 03/01/2005  FINDINGS: The visualized lung bases are clear.  The liver and spleen are unremarkable in appearance. The gallbladder is within normal limits. The pancreas and adrenal glands are unremarkable.  The kidneys are unremarkable in appearance. There is no evidence of hydronephrosis. No renal or ureteral stones are seen. No perinephric stranding is appreciated.  The small bowel is unremarkable in appearance. The stomach is within normal limits. No acute vascular abnormalities are seen. Scattered calcification is noted along the abdominal aorta and its branches. Incidental note is made of a  retroaortic left renal vein.  The appendix is normal in caliber, without evidence for appendicitis.  There is diffuse wall thickening involving the entirety of the colon and rectum, with surrounding soft tissue inflammation. Findings are compatible with diffuse colitis, either infectious or inflammatory in nature. Contrast progresses to the level of the proximal sigmoid colon. No significant free fluid is seen.  The bladder is mildly distended and grossly unremarkable in appearance. The uterus is within normal limits. Scattered Nabothian cysts are seen. The ovaries are relatively symmetric; no suspicious adnexal masses are identified. No inguinal lymphadenopathy is seen.  No acute osseous abnormalities are identified.  IMPRESSION: 1. Diffuse wall thickening involving the entirety of the colon and rectum, with surrounding soft tissue inflammation. Findings compatible with diffuse colitis, either infectious or inflammatory in nature. 2. Scattered calcification along the abdominal aorta and its branches.   Electronically Signed   By:  Roanna Raider M.D.   On: 03/15/2013 23:18     Assessment/Plan Principal Problem:   Colitis   1. Diffuse colitis - given the patient's recent history of having taken antibiotics most likely patient may be having C. difficile colitis. At this time patient has been placed on Flagyl. Check stool for C. difficile and cultures continued IV fluids. 2. History of COPD - presently not wheezing. 3. Tobacco abuse - advised to quit smoking.    Code Status: Full code  Family Communication: Family at the bedside.  Disposition Plan: Admit to inpatient.    Marieta Markov N. [facility] Pager [phone].  If 7PM-7AM, please contact night-coverage [email or web address] Password [email or web address] 03/16/2013, 1:59 AM

## 2013-03-17 DIAGNOSIS — F411 Generalized anxiety disorder: Secondary | ICD-10-CM

## 2013-03-17 MED ORDER — METRONIDAZOLE 500 MG PO TABS: 500.0000 mg | ORAL_TABLET | Freq: Three times a day (TID) | ORAL | Status: AC

## 2013-03-17 MED ORDER — ONDANSETRON HCL 4 MG PO TABS: 4.0000 mg | ORAL_TABLET | Freq: Every day | ORAL | Status: DC | PRN

## 2013-03-17 NOTE — Progress Notes (Signed)
Discharge instructions reviewed with pt and prescriptions given.  Pt verbalized understanding and had no questions.  Pt discharged in stable condition with friend.  Andrw Mcguirt Lindsay   

## 2013-03-17 NOTE — Discharge Summary (Signed)
Physician Discharge Summary  Ann Ramsey ZOX:[id number] DOB: [birth year] DOA: 03/15/2013  PCP: Salvadore Dom, MD  Admit date: 03/15/2013 Discharge date: 03/17/2013  Time spent: <30minutes  Recommendations for Outpatient Follow-up:  Follow-up Information   Follow up with [facility], MD. (in 1-2weeks, call for appt upon discharge)    Specialty:  Internal Medicine     followup labs per PCP -CBC in 2 weeks  Discharge Diagnoses:  Principal Problem:   Colitis, Clostridium difficile Active Problems:   Malnutrition of moderate degree   Discharge Condition: improved/stable  Diet recommendation: regular  Filed Weights   03/15/13 1613  Weight: 50.894 kg (112 lb 3.2 oz)    History of present illness:  Ann Ramsey is a 47 y.o. female history of COPD, tobacco abuse has been experiencing severe diarrhea last 2 days with abdominal cramps. Patient had originally gone to urgent care Center and was referred to the ER. In the ER patient had CT abdomen pelvis which shows diffuse colitis. Patient last month during the early part of October had Levaquin for bronchitis and following which patient had a course of clindamycin for tooth abscess. For almost a month patient has been having diarrhea off and on but it is worse in last 2 days with at least 10-15 episodes. Patient's abdominal pain is diffuse crampy with no associated nausea vomiting, fever chills. Patient denies any chest pain or short of breath.    Hospital Course:  C. Diff colitis -As discussed above, upon admission C. difficile was done and was positive patient was started on -Patient is clinically improved, has had no diarrhea today and is tolerating by mouth well -She is also tolerating oral Flagyl and will be discharged on it (FLAGYL) to complete a total of 14 days of antibiotics -She is to followup with her PCP Narcotic dependence due to surgery many years ago, pain score down to 2  -On admission she was started  on when necessary IV medications, on followup she was improving clinically her oral analgesics are resumed. Anxiety disorder with panic attacks. States she has never been on controlled medications and only has been on xanax previously. States her anxiety is completely unrelated to her adderall use. -He she is to follow up with outpatient MDs regarding her preadmission medications/and further management  ADD: She is to continue her Adderall upon discharge and Followup with outpatient MDs as above.  COPD, stable.  Leukocytosis, secondary to #1 improved with hydration and antibiotics  Normocytic anemia, likely secondary to marrow suppression from acute illness and iron deficiency. Repeat by PCP in 2-3 weeks. Continued iron   Procedures:  none  Consultations:  none  Discharge Exam: Filed Vitals:   03/17/13 0625  BP: 125/67  Pulse: 98  Temp: 98.2 F (36.8 C)  Resp: 21   Exam:  General: alert & oriented x 3 In NAD Cardiovascular: RRR, nl S1 s2 Respiratory: CTAB Abdomen: soft +BS decreased tenderness/ND, no masses palpable Extremities: No cyanosis and no edema     Discharge Instructions  Discharge Orders   Future Orders Complete By Expires   Diet general  As directed    Increase activity slowly  As directed        Medication List    STOP taking these medications       BC HEADACHE PO      TAKE these medications       ALPRAZolam 1 MG tablet  Commonly known as:  XANAX  Take 1 mg by mouth 4 (four)  times daily as needed for anxiety (panic attacks).     amphetamine-dextroamphetamine 20 MG tablet  Commonly known as:  ADDERALL  Take 20 mg by mouth 3 (three) times daily.     diphenhydrAMINE 25 MG tablet  Commonly known as:  BENADRYL  Take 25 mg by mouth daily as needed for allergies.     FERROCITE PLUS 106-1 MG Tabs  Take 1 tablet by mouth daily.     ibuprofen 800 MG tablet  Commonly known as:  ADVIL,MOTRIN  Take 800 mg by mouth every 8 (eight) hours as needed  (pain).     KLONOPIN PO  Take 1 tablet by mouth once.     metroNIDAZOLE 500 MG tablet  Commonly known as:  FLAGYL  Take 1 tablet (500 mg total) by mouth every 8 (eight) hours.     ondansetron 4 MG tablet  Commonly known as:  ZOFRAN  Take 1 tablet (4 mg total) by mouth daily as needed for nausea or vomiting.     oxyCODONE-acetaminophen 10-325 MG per tablet  Commonly known as:  PERCOCET  Take 1 tablet by mouth every 4 (four) hours as needed for pain.     traMADol 50 MG tablet  Commonly known as:  ULTRAM  Take 50 mg by mouth every 8 (eight) hours as needed (pain).     triamcinolone cream 0.1 %  Commonly known as:  KENALOG  Apply 1 application topically daily as needed (itching).     zolpidem 10 MG tablet  Commonly known as:  AMBIEN  Take 10 mg by mouth at bedtime as needed for sleep.       No Known Allergies     Follow-up Information   Follow up with [facility], MD. (in 1-2weeks, call for appt upon discharge)    Specialty:  Internal Medicine       The results of significant diagnostics from this hospitalization (including imaging, microbiology, ancillary and laboratory) are listed below for reference.    Significant Diagnostic Studies: Ct Abdomen Pelvis W Contrast  03/15/2013   CLINICAL DATA:  Generalized abdominal pain, diarrhea and nausea.  EXAM: CT ABDOMEN AND PELVIS WITH CONTRAST  TECHNIQUE: Multidetector CT imaging of the abdomen and pelvis was performed using the standard protocol following bolus administration of intravenous contrast.  CONTRAST:  OMNIPAQUE IOHEXOL 300 MG/ML  SOLN  COMPARISON:  Abdominal ultrasound performed 03/01/2005  FINDINGS: The visualized lung bases are clear.  The liver and spleen are unremarkable in appearance. The gallbladder is within normal limits. The pancreas and adrenal glands are unremarkable.  The kidneys are unremarkable in appearance. There is no evidence of hydronephrosis. No renal or ureteral stones are seen. No  perinephric stranding is appreciated.  The small bowel is unremarkable in appearance. The stomach is within normal limits. No acute vascular abnormalities are seen. Scattered calcification is noted along the abdominal aorta and its branches. Incidental note is made of a retroaortic left renal vein.  The appendix is normal in caliber, without evidence for appendicitis.  There is diffuse wall thickening involving the entirety of the colon and rectum, with surrounding soft tissue inflammation. Findings are compatible with diffuse colitis, either infectious or inflammatory in nature. Contrast progresses to the level of the proximal sigmoid colon. No significant free fluid is seen.  The bladder is mildly distended and grossly unremarkable in appearance. The uterus is within normal limits. Scattered Nabothian cysts are seen. The ovaries are relatively symmetric; no suspicious adnexal masses are identified. No inguinal lymphadenopathy  is seen.  No acute osseous abnormalities are identified.  IMPRESSION: 1. Diffuse wall thickening involving the entirety of the colon and rectum, with surrounding soft tissue inflammation. Findings compatible with diffuse colitis, either infectious or inflammatory in nature. 2. Scattered calcification along the abdominal aorta and its branches.   Electronically Signed   By: Roanna Raider M.D.   On: 03/15/2013 23:18    Microbiology: Recent Results (from the past 240 hour(s))  CLOSTRIDIUM DIFFICILE BY PCR     Status: Abnormal   Collection Time    03/16/13  5:52 AM      Result Value Range Status   C difficile by pcr POSITIVE (*) NEGATIVE Final   Comment: CRITICAL RESULT CALLED TO, READ BACK BY AND VERIFIED WITH:     C HUCKABEE,RN 03/16/13 0908 BY K SCHULTZ  STOOL CULTURE     Status: None   Collection Time    03/16/13  5:52 AM      Result Value Range Status   Specimen Description STOOL   Final   Special Requests NONE   Final   Culture     Final   Value: Culture reincubated for  better growth     Performed at [facility]   Report Status PENDING   Incomplete     Labs: Basic Metabolic Panel:  Recent Labs Lab 03/15/13 1615 03/16/13 0535  NA 135 137  K 3.8 3.5  CL 101 104  CO2 22 22  GLUCOSE 103* 75  BUN 10 7  CREATININE 0.72 0.62  CALCIUM 8.7 8.1*   Liver Function Tests:  Recent Labs Lab 03/15/13 1615  AST 21  ALT 14  ALKPHOS 72  BILITOT 0.4  PROT 7.1  ALBUMIN 3.3*    Recent Labs Lab 03/15/13 1615  LIPASE 10*   No results found for this basename: AMMONIA,  in the last 168 hours CBC:  Recent Labs Lab 03/15/13 1615 03/16/13 0535  WBC 15.7* 12.1*  NEUTROABS 13.8*  --   HGB 15.9* 12.5  HCT 44.6 36.8  MCV 87.5 88.2  PLT 195 166   Cardiac Enzymes: No results found for this basename: CKTOTAL, CKMB, CKMBINDEX, TROPONINI,  in the last 168 hours BNP: BNP (last 3 results) No results found for this basename: PROBNP,  in the last 8760 hours CBG: No results found for this basename: GLUCAP,  in the last 168 hours     Signed:  Ashad Fawbush C  [facility] 03/17/2013, 1:18 PM

## 2013-03-20 LAB — STOOL CULTURE

## 2015-02-22 ENCOUNTER — Encounter: Payer: Self-pay | Admitting: Gastroenterology

## 2015-02-23 ENCOUNTER — Encounter: Payer: Self-pay | Admitting: Gastroenterology

## 2015-04-11 ENCOUNTER — Ambulatory Visit (AMBULATORY_SURGERY_CENTER): Payer: Self-pay

## 2015-04-11 VITALS — Ht 61.0 in | Wt 130.2 lb

## 2015-04-11 DIAGNOSIS — Z1211 Encounter for screening for malignant neoplasm of colon: Secondary | ICD-10-CM

## 2015-04-11 NOTE — Progress Notes (Signed)
No allergies to eggs or soy No past problems with anesthesia No diet/weight loss meds No home oxygen  Has email and internet; registered for emmi

## 2015-04-12 ENCOUNTER — Encounter: Payer: Self-pay | Admitting: Gastroenterology

## 2015-04-12 ENCOUNTER — Ambulatory Visit (INDEPENDENT_AMBULATORY_CARE_PROVIDER_SITE_OTHER): Payer: PPO | Admitting: Gastroenterology

## 2015-04-12 VITALS — BP 126/72 | HR 80 | Ht 60.25 in | Wt 130.1 lb

## 2015-04-12 DIAGNOSIS — R1011 Right upper quadrant pain: Secondary | ICD-10-CM | POA: Diagnosis not present

## 2015-04-12 DIAGNOSIS — Z791 Long term (current) use of non-steroidal anti-inflammatories (NSAID): Secondary | ICD-10-CM | POA: Diagnosis not present

## 2015-04-12 DIAGNOSIS — R140 Abdominal distension (gaseous): Secondary | ICD-10-CM | POA: Diagnosis not present

## 2015-04-12 NOTE — Progress Notes (Signed)
04/12/2015 BRIANNON HYLER XO:[id number] [birth year]   HISTORY OF PRESENT ILLNESS:  This is a 49 year old female who is new to our practice. She was scheduled for a screening colonoscopy with Dr. Ardis Hughs for later this month. While at her pre-visit yesterday she mentioned some GI symptoms that she was having so was given this appointment today. She reports intermittent right upper quadrant abdominal pain that has been present for the past one and a half years or so. She describes the pain as cramping in her right upper quadrant that occurs a couple of times per week.  She says that sometimes there is even and knot or a bulge in her right upper quadrant when she has the pain. When the pain resolves and she is still left with some soreness in her upper abdomen and a lot of bloating. She has a history of C. difficile in 2014 and at that time a CT scan did not show any gallbladder abnormalities. She says that she has been in the ER at [facility] couple of times recently for the pain and they have done CT scans, which were reportedly negative. She does admit to taking a lot of BC powders, one every day for at least the past year. She also takes ibuprofen as needed. She was previously on omeprazole 20 mg daily, but has not been taking that for the past 2 months because she did not feel like it was making any difference in her symptoms.   Past Medical History  Diagnosis Date  . COPD (chronic obstructive pulmonary disease) ([place])   . ADD (attention deficit disorder)   . Anxiety disorder   . C. difficile colitis     induced by antibiotics  . HTN (hypertension)   . GERD (gastroesophageal reflux disease)    Past Surgical History  Procedure Laterality Date  . Trigger finger release Bilateral     left thumb, right middle finger  . Cesarean section      x 3  . Cervical fusion    . Tubal ligation    . Elbow arthroscopy with tendon reconstruction Left     reports that she has been smoking  Cigarettes.  She has a 18.5 pack-year smoking history. She has never used smokeless tobacco. She reports that she drinks alcohol. She reports that she does not use illicit drugs. family history includes CAD in her father; COPD in her mother; Diabetes in her father; Other in her sister; Throat cancer in her maternal grandmother. There is no history of Colon cancer. No Known Allergies    Outpatient Encounter Prescriptions as of 04/12/2015  Medication Sig  . ALPRAZolam (XANAX) 1 MG tablet Take 1 mg by mouth 4 (four) times daily as needed for anxiety (panic attacks).  Marland Kitchen amphetamine-dextroamphetamine (ADDERALL) 20 MG tablet Take 20 mg by mouth 3 (three) times daily.  . diphenhydrAMINE (BENADRYL) 25 MG tablet Take 25 mg by mouth daily as needed for allergies.  Marland Kitchen ibuprofen (ADVIL,MOTRIN) 800 MG tablet Take 800 mg by mouth every 8 (eight) hours as needed (pain).  . metoprolol succinate (TOPROL-XL) 25 MG 24 hr tablet Take 25 mg by mouth daily.  Marland Kitchen omeprazole (PRILOSEC) 20 MG capsule Take 20 mg by mouth daily.  . ondansetron (ZOFRAN) 4 MG tablet Take 1 tablet (4 mg total) by mouth daily as needed for nausea or vomiting.  Marland Kitchen oxyCODONE-acetaminophen (PERCOCET) 10-325 MG per tablet Take 1 tablet by mouth every 4 (four) hours as needed for pain.  . Probiotic  Product (PROBIOTIC PO) Take 1 tablet by mouth daily.  . traMADol (ULTRAM) 50 MG tablet Take 50 mg by mouth every 8 (eight) hours as needed (pain).  . triamcinolone cream (KENALOG) 0.1 % Apply 1 application topically daily as needed (itching).  . zolpidem (AMBIEN) 10 MG tablet Take 10 mg by mouth at bedtime as needed for sleep.   No facility-administered encounter medications on file as of 04/12/2015.     REVIEW OF SYSTEMS  : All other systems reviewed and negative except where noted in the History of Present Illness.   PHYSICAL EXAM: BP 126/72 mmHg  Pulse 80  Ht 5' 0.25" (1.53 m)  Wt 130 lb 2 oz (59.024 kg)  BMI 25.21 kg/m2  LMP  02/15/2013 General: Well developed white female in no acute distress Head: Normocephalic and atraumatic Eyes:  Sclerae anicteric, conjunctiva pink. Ears: Normal auditory acuity Lungs: Clear throughout to auscultation Heart: Regular rate and rhythm Abdomen: Soft, non-distended.  Normal bowel sounds.  Mild RUQ TTP. Rectal:  Will be done at the time of colonoscopy. Musculoskeletal: Symmetrical with no gross deformities  Skin: No lesions on visible extremities Extremities: No edema  Neurological: Alert oriented x 4, grossly non-focal Psychological:  Alert and cooperative. Normal mood and affect  ASSESSMENT AND PLAN: -RUQ abdominal pain and bloating:  Intermittent for 1.5 years.  Negative CT scan in 2014 and apparently 2 recently negative at [facility].  Pain sounds gallbladder related. We will perform abdominal ultrasound and if negative, then HIDA scan. She does use a lot of BC powders and Ibuprofen so it is possible that she could have ulcer disease, but her description of the pain sounds less like that. I have asked her to discontinue the NSAID use.  If gallbladder evaluation proves negative, then may need to consider EGD. She will resume her omeprazole 20 mg daily empirically for now as well.  *We'll try to obtain results from [facility].   CC:   Harvie Junior, MD

## 2015-04-12 NOTE — Patient Instructions (Addendum)
You have been given a separate informational sheet regarding your tobacco use, the importance of quitting and local resources to help you quit.  You have been scheduled for an ultrasound and CCK HIDA scan for 05/03/15 at [facility] for 8:30.  Please arrive in radiology at 8:15.  Please do not eat or drink after midnight.

## 2015-04-13 NOTE — Progress Notes (Signed)
i agree with the above note, plan 

## 2015-04-25 ENCOUNTER — Ambulatory Visit (AMBULATORY_SURGERY_CENTER): Payer: PPO | Admitting: Gastroenterology

## 2015-04-25 ENCOUNTER — Encounter: Payer: Self-pay | Admitting: Gastroenterology

## 2015-04-25 VITALS — BP 126/81 | HR 75 | Temp 97.5°F | Resp 18 | Ht 61.0 in | Wt 130.0 lb

## 2015-04-25 DIAGNOSIS — Z1211 Encounter for screening for malignant neoplasm of colon: Secondary | ICD-10-CM

## 2015-04-25 DIAGNOSIS — D125 Benign neoplasm of sigmoid colon: Secondary | ICD-10-CM | POA: Diagnosis not present

## 2015-04-25 DIAGNOSIS — K635 Polyp of colon: Secondary | ICD-10-CM

## 2015-04-25 MED ORDER — SODIUM CHLORIDE 0.9 % IV SOLN: 500.0000 mL | INTRAVENOUS | Status: DC

## 2015-04-25 NOTE — Progress Notes (Signed)
Report to PACU, RN, vss, BBS= Clear.  

## 2015-04-25 NOTE — Op Note (Signed)
[facility]  [address] [place], [address]   COLONOSCOPY PROCEDURE REPORT  PATIENT: Ann Ramsey, Ann Ramsey  MR#: [id number] BIRTHDATE: 03/04/1966 , 49  yrs. old GENDER: female ENDOSCOPIST: Milus Banister, MD REFERRED BY: York Ram, MD PROCEDURE DATE:  04/25/2015 PROCEDURE:   Colonoscopy with snare polypectomy First Screening Colonoscopy - Avg.  risk and is 50 yrs.  old or older Yes.  Prior Negative Screening - Now for repeat screening. N/A  History of Adenoma - Now for follow-up colonoscopy & has been > or = to 3 yrs.  N/A  Polyps Removed Today Polyps removed today? Yes ASA CLASS:   Class II INDICATIONS:Screening for colonic neoplasia and Colorectal Neoplasm Risk Assessment for this procedure is average risk. MEDICATIONS: Monitored anesthesia care and Propofol 300 mg IV  DESCRIPTION OF PROCEDURE:   After the risks benefits and alternatives of the procedure were thoroughly explained, informed consent was obtained.  The digital rectal exam revealed no abnormalities of the rectum.   The LB [id number]  endoscope was introduced through the anus and advanced to the cecum, which was identified by both the appendix and ileocecal valve. No adverse events experienced.   The quality of the prep was excellent.  The instrument was then slowly withdrawn as the colon was fully examined. Estimated blood loss is zero unless otherwise noted in this procedure report.   COLON FINDINGS: A sessile polyp measuring 3 mm in size was found in the sigmoid colon.  A polypectomy was performed with a cold snare. The resection was complete, the polyp tissue was completely retrieved and sent to histology.   The examination was otherwise normal.  Retroflexed views revealed no abnormalities. The time to cecum = 2.4 Withdrawal time = 7.3   The scope was withdrawn and the procedure completed. COMPLICATIONS: There were no immediate complications.  ENDOSCOPIC IMPRESSION: 1.    Sessile polyp was found in the sigmoid colon; polypectomy was performed with a cold snare 2.   The examination was otherwise normal  RECOMMENDATIONS: 1. If the polyp(s) removed today are proven to be adenomatous (pre-cancerous) polyps, you will need a repeat colonoscopy in 5 years.  Otherwise you should continue to follow colorectal cancer screening guidelines for "routine risk" patients with colonoscopy in 10 years.  You will receive a letter within 1-2 weeks with the results of your biopsy as well as final recommendations.  Please call my office if you have not received a letter after 3 weeks. 2. Continue with upcoming gallbladder testing.  eSigned:  Milus Banister, MD 04/25/2015 10:48 AM

## 2015-04-25 NOTE — Patient Instructions (Addendum)
One of your biggest health concerns is your smoking.  This increases your risk for most cancers and serious cardiovascular diseases such as strokes, heart attacks.  You should try your best to stop.  If you need assistance, please contact your PCP or Smoking Cessation Class at [facility] [phone]) or [place] (1-800-QUIT-NOW).    YOU HAD AN ENDOSCOPIC PROCEDURE TODAY AT [facility]:   Refer to the procedure report that was given to you for any specific questions about what was found during the examination.  If the procedure report does not answer your questions, please call your gastroenterologist to clarify.  If you requested that your care partner not be given the details of your procedure findings, then the procedure report has been included in a sealed envelope for you to review at your convenience later.  YOU SHOULD EXPECT: Some feelings of bloating in the abdomen. Passage of more gas than usual.  Walking can help get rid of the air that was put into your GI tract during the procedure and reduce the bloating. If you had a lower endoscopy (such as a colonoscopy or flexible sigmoidoscopy) you may notice spotting of blood in your stool or on the toilet paper. If you underwent a bowel prep for your procedure, you may not have a normal bowel movement for a few days.  Please Note:  You might notice some irritation and congestion in your nose or some drainage.  This is from the oxygen used during your procedure.  There is no need for concern and it should clear up in a day or so.  SYMPTOMS TO REPORT IMMEDIATELY:   Following lower endoscopy (colonoscopy or flexible sigmoidoscopy):  Excessive amounts of blood in the stool  Significant tenderness or worsening of abdominal pains  Swelling of the abdomen that is new, acute  Fever of 100F or higher    For urgent or emergent issues, a gastroenterologist can be reached at any hour by calling [phone]  [phone].   DIET: Your first meal following the procedure should be a small meal and then it is ok to progress to your normal diet. Heavy or fried foods are harder to digest and may make you feel nauseous or bloated.  Likewise, meals heavy in dairy and vegetables can increase bloating.  Drink plenty of fluids but you should avoid alcoholic beverages for 24 hours.  ACTIVITY:  You should plan to take it easy for the rest of today and you should NOT DRIVE or use heavy machinery until tomorrow (because of the sedation medicines used during the test).    FOLLOW UP: Our staff will call the number listed on your records the next business day following your procedure to check on you and address any questions or concerns that you may have regarding the information given to you following your procedure. If we do not reach you, we will leave a message.  However, if you are feeling well and you are not experiencing any problems, there is no need to return our call.  We will assume that you have returned to your regular daily activities without incident.  If any biopsies were taken you will be contacted by phone or by letter within the next 1-3 weeks.  Please call us at [phone] if you have not heard about the biopsies in 3 weeks.    SIGNATURES/CONFIDENTIALITY: You and/or your care partner have signed paperwork which will be entered into your electronic medical record.  These signatures attest to  the fact that that the information above on your After Visit Summary has been reviewed and is understood.  Full responsibility of the confidentiality of this discharge information lies with you and/or your care-partner.   Resume medications. Information given on polyps.

## 2015-04-25 NOTE — Progress Notes (Signed)
Patient states that she has COPD, and has had a cold.  She states that her lungs are "full", and requests the CRNA to listen. Patient states that she has a nebulizer, but never uses it.

## 2015-04-25 NOTE — Progress Notes (Signed)
Called to room to assist during endoscopic procedure.  Patient ID and intended procedure confirmed with present staff. Received instructions for my participation in the procedure from the performing physician.  

## 2015-04-26 ENCOUNTER — Telehealth: Payer: Self-pay | Admitting: *Deleted

## 2015-04-26 NOTE — Telephone Encounter (Signed)
  Follow up Call-  Call back number 04/25/2015  Post procedure Call Back phone  # [phone]  Permission to leave phone message Yes     Patient questions:  Do you have a fever, pain , or abdominal swelling? No. Pain Score  0 *  Have you tolerated food without any problems? Yes.    Have you been able to return to your normal activities? Yes.    Do you have any questions about your discharge instructions: Diet   No. Medications  No. Follow up visit  No.  Do you have questions or concerns about your Care? No.  Actions: * If pain score is 4 or above: No action needed, pain <4.

## 2015-05-03 ENCOUNTER — Encounter (HOSPITAL_COMMUNITY)
Admission: RE | Admit: 2015-05-03 | Discharge: 2015-05-03 | Disposition: A | Discharge status: Home / Self Care | Payer: PPO | Source: Ambulatory Visit | Attending: Gastroenterology | Admitting: Gastroenterology

## 2015-05-03 ENCOUNTER — Ambulatory Visit (HOSPITAL_COMMUNITY)
Admission: RE | Admit: 2015-05-03 | Discharge: 2015-05-03 | Disposition: A | Discharge status: Home / Self Care | Payer: PPO | Source: Ambulatory Visit | Attending: Gastroenterology | Admitting: Gastroenterology

## 2015-05-03 DIAGNOSIS — R1011 Right upper quadrant pain: Secondary | ICD-10-CM | POA: Insufficient documentation

## 2015-05-03 MED ORDER — SINCALIDE 5 MCG IJ SOLR
0.0200 ug/kg | Freq: Once | INTRAMUSCULAR | Status: AC
  Administered 2015-05-03: 1.2 ug via INTRAVENOUS

## 2015-05-03 MED ORDER — TECHNETIUM TC 99M MEBROFENIN IV KIT
5.2200 | PACK | Freq: Once | INTRAVENOUS | Status: AC | PRN
  Administered 2015-05-03: 5.22 via INTRAVENOUS

## 2015-05-04 ENCOUNTER — Encounter: Payer: Self-pay | Admitting: Gastroenterology

## 2015-05-04 ENCOUNTER — Telehealth: Payer: Self-pay | Admitting: Gastroenterology

## 2015-05-04 NOTE — Telephone Encounter (Signed)
[place] and hida were both normal.  She needs EGD for RUQ, abd pains to continue the workup.  thanks

## 2015-05-04 NOTE — Telephone Encounter (Signed)
The pt is aware of the results and states she is not having any pain at this time and she wants to call back to set up EGD if the pains return.

## 2015-05-04 NOTE — Telephone Encounter (Signed)
Dr Ardis Hughs can you review the HIDA scan results?

## 2015-05-05 ENCOUNTER — Encounter: Payer: Self-pay | Admitting: Gastroenterology

## 2016-07-15 ENCOUNTER — Encounter: Payer: Self-pay | Admitting: Allergy and Immunology

## 2016-07-15 ENCOUNTER — Ambulatory Visit (INDEPENDENT_AMBULATORY_CARE_PROVIDER_SITE_OTHER): Payer: Medicare Other | Admitting: Allergy and Immunology

## 2016-07-15 VITALS — BP 130/70 | HR 108 | Temp 98.3°F | Resp 20 | Ht 60.16 in | Wt 138.4 lb

## 2016-07-15 DIAGNOSIS — L989 Disorder of the skin and subcutaneous tissue, unspecified: Secondary | ICD-10-CM

## 2016-07-15 DIAGNOSIS — Z720 Tobacco use: Secondary | ICD-10-CM | POA: Diagnosis not present

## 2016-07-15 DIAGNOSIS — R140 Abdominal distension (gaseous): Secondary | ICD-10-CM

## 2016-07-15 DIAGNOSIS — J449 Chronic obstructive pulmonary disease, unspecified: Secondary | ICD-10-CM

## 2016-07-15 DIAGNOSIS — L308 Other specified dermatitis: Secondary | ICD-10-CM

## 2016-07-15 NOTE — Patient Instructions (Addendum)
  1. Allergen avoidance measures?  2. Biological agent for psoriasis?  3. Obtain an upper endoscopy for Helicobacter pylori and celiac disease  4. Use nicotine substitutes to eliminate tobacco smoke exposure

## 2016-07-15 NOTE — Progress Notes (Signed)
Dear Dr. Jimmye Ramsey,  Thank you for referring Ann Ramsey to the [facility] on 07/15/2016.   Below is a summation of this patient's evaluation and recommendations.  Thank you for your referral. I will keep you informed about this patient's response to treatment.   If you have any questions please do not hesitate to contact me.   Sincerely,  Ann Prows, MD Allergy / [facility]   ______________________________________________________________________    NEW PATIENT NOTE  Referring Provider: Harvie Junior, MD Primary Provider: Harvie Junior, MD Date of office visit: 07/15/2016    Subjective:   Chief Complaint:  Ann Ramsey (DOB: 12-29-1965) is a 52 y.o. female who presents to the clinic on 07/15/2016 with a chief complaint of Angioedema (Leg swelling) and COPD .     HPI: Ann Ramsey presents to this clinic in evaluation of problems with her skin and problems with her abdomen.  Apparently she has been given the diagnosis of psoriasis by Dr. Loyal Ramsey. Over the course of the past 3 years she's been having a progressive dermatitis affecting predominantly her lower extremities and her scalp. This does not appear to be responding to topical agents including the use of topical steroids. It was recommended by Dr. Jimmye Ramsey that she consider using Humira but because of the potential side effects she has not done so to date. She is wondering if there is some type of misdiagnosis ongoing at this point and there may be a allergic trigger giving rise to her skin issue. She does have some associated arthritis involving her neck and lower back and shoulders and hands.  As well, she has problems with abdominal bloating. She has a several year history of having recurrent problems with abdominal bloating and abdominal tenderness. She has apparently seen lots of different  physicians and has had lots of studies done in investigation of this issue. In 2018 she had a colonoscopy performed which apparently was normal. She has not had an upper endoscopy performed to date. She has never tried a gluten-free diet.  She also has a problem with hair dye. She will develop a huge dermatitis around her scalp the last time she used hair dye which was approximately 3 years ago.  She smokes approximately one pack per day since the age of 59. She does not have a tremendous amount of coughing or shortness of breath at this point.  Past Medical History:  Diagnosis Date  . ADD (attention deficit disorder)   . Anxiety disorder   . C. difficile colitis    induced by antibiotics  . COPD (chronic obstructive pulmonary disease) ([place])   . GERD (gastroesophageal reflux disease)   . HTN (hypertension)   . Plaque psoriasis   . Psoriatic arthritis [facility])     Past Surgical History:  Procedure Laterality Date  . CERVICAL FUSION    . CESAREAN SECTION     x 3  . ELBOW ARTHROSCOPY WITH TENDON RECONSTRUCTION Left   . TRIGGER FINGER RELEASE Bilateral    left thumb, right middle finger  . TUBAL LIGATION      Allergies as of 07/15/2016   No Known Allergies     Medication List      ALPRAZolam 1 MG tablet Commonly known as:  XANAX Take 1 mg by mouth 4 (four) times daily as needed for anxiety (panic attacks).   ARTHRITIS STRENGTH BC POWDER PO  Take by mouth.   Curcumin Extract Powd Take by mouth.   diphenhydrAMINE 25 MG tablet Commonly known as:  BENADRYL Take 25 mg by mouth daily as needed for allergies. Reported on 04/25/2015   ibuprofen 800 MG tablet Commonly known as:  ADVIL,MOTRIN Take 800 mg by mouth every 8 (eight) hours as needed (pain).   omeprazole 20 MG capsule Commonly known as:  PRILOSEC Take 40 mg by mouth daily.   ondansetron 8 MG disintegrating tablet Commonly known as:  ZOFRAN-ODT   Oxycodone HCl 10 MG Tabs   triamcinolone cream 0.1 % Commonly  known as:  KENALOG Apply 1 application topically daily as needed (itching).   zolpidem 10 MG tablet Commonly known as:  AMBIEN Take 10 mg by mouth at bedtime as needed for sleep.       Review of systems negative except as noted in HPI / PMHx or noted below:  Review of Systems  Constitutional: Negative.   HENT: Negative.   Eyes: Negative.   Respiratory: Negative.   Cardiovascular: Negative.   Gastrointestinal: Negative.   Genitourinary: Negative.   Musculoskeletal: Negative.   Skin: Negative.   Neurological: Negative.   Endo/Heme/Allergies: Negative.   Psychiatric/Behavioral: Negative.     Family History  Problem Relation Age of Onset  . CAD Father   . Diabetes Father   . Throat cancer Maternal Grandmother   . COPD Mother   . Other Sister     bleeding on the brain  . Colon cancer Neg Hx     Social History   Social History  . Marital status: Married    Spouse name: N/A  . Number of children: 3  . Years of education: N/A   Occupational History  . disabled    Social History Main Topics  . Smoking status: Current Every Day Smoker    Packs/day: 0.50    Years: 37.00    Types: Cigarettes  . Smokeless tobacco: Never Used  . Alcohol use No     Comment: occ  . Drug use: No  . Sexual activity: Yes    Birth control/ protection: None   Other Topics Concern  . Not on file   Social History Narrative  . No narrative on file    Environmental and Social history  Lives in a house with a dry environment, a dog located inside the household, carpeting in the bedroom, no plastic on the bed or pillow, and actually smoking tobacco products.  Objective:   Vitals:   07/15/16 1354  BP: 130/70  Pulse: (!) 108  Resp: 20  Temp: 98.3 F (36.8 C)   Height: 5' 0.16" (152.8 cm) Weight: 138 lb 6.4 oz (62.8 kg)  Physical Exam  Constitutional: She is well-developed, well-nourished, and in no distress.  HENT:  Head: Normocephalic. Head is without right periorbital  erythema and without left periorbital erythema.  Right Ear: Tympanic membrane, external ear and ear canal normal.  Left Ear: Tympanic membrane, external ear and ear canal normal.  Nose: Nose normal. No mucosal edema or rhinorrhea.  Mouth/Throat: Uvula is midline, oropharynx is clear and moist and mucous membranes are normal. No oropharyngeal exudate.  Eyes: Conjunctivae and lids are normal. Pupils are equal, round, and reactive to light.  Neck: Trachea normal. No tracheal tenderness present. No tracheal deviation present. No thyromegaly present.  Cardiovascular: Normal rate, regular rhythm, S1 normal, S2 normal and normal heart sounds.   No murmur heard. Pulmonary/Chest: Effort normal and breath sounds normal. No stridor. No tachypnea.  No respiratory distress. She has no wheezes. She has no rales. She exhibits no tenderness.  Abdominal: Soft. She exhibits no distension and no mass. There is no hepatosplenomegaly. There is no tenderness. There is no rebound and no guarding.  Musculoskeletal: She exhibits no edema or tenderness.  Lymphadenopathy:       Head (right side): No tonsillar adenopathy present.       Head (left side): No tonsillar adenopathy present.    She has no cervical adenopathy.    She has no axillary adenopathy.  Neurological: She is alert. Gait normal.  Skin: Rash (multiple circumferential erythematous scaly lesions reminiscent of good take psoriasis and plaque psoriasis involving her lower extremities bilaterally with large plaques involving scalp.) noted. She is not diaphoretic. No erythema. No pallor. Nails show no clubbing.  Psychiatric: Mood and affect normal.    Diagnostics: Allergy skin tests were performed. She did not demonstrate any hypersensitivity to a screening panel of foods.  Spirometry was performed and demonstrated an FEV1 of 1.34 @ 56 % of predicted.  Assessment and Plan:    1. Inflammatory dermatosis   2. Tobacco use   3. Abdominal bloating   4. COPD,  moderate ([employer])     1. Allergen avoidance measures?  2. Biological agent for psoriasis?  3. Obtain an upper endoscopy for Helicobacter pylori and celiac disease  4. Use nicotine substitutes to eliminate tobacco smoke exposure  There does not appear to be any significant evidence that atopic disease is driving some of her immunological hyperreactivity directed against her skin and possibly her joints. This appears to be mostly a psoriatic issue. I did have a talk with her today about the use of biological agents and asked her to discuss this issue in more detail with Dr. Jimmye Ramsey. For her abdominal bloating I encouraged her to follow up with a gastroenterologist to obtain an upper endoscopy to look for Helicobacter pylori infection and celiac disease. For her extensive tobacco abuse with apparent COPD I did have a long talk with her today about the fact that she has lost 50% of her lung function and she needs to eliminate tobacco smoke exposure by utilizing some type of nicotine substitute.  Ann Prows, MD [facility]

## 2016-07-18 ENCOUNTER — Telehealth: Payer: Self-pay | Admitting: Gastroenterology

## 2016-07-18 NOTE — Telephone Encounter (Signed)
The pt has been advised she will need to come in for an office visit since she has not been seen since 2016.  Appt has been made and pt is aware

## 2016-09-13 ENCOUNTER — Ambulatory Visit (INDEPENDENT_AMBULATORY_CARE_PROVIDER_SITE_OTHER): Payer: Medicare Other | Admitting: Gastroenterology

## 2016-09-13 ENCOUNTER — Encounter: Payer: Self-pay | Admitting: Gastroenterology

## 2016-09-13 VITALS — BP 118/80 | HR 96 | Ht 60.25 in | Wt 139.0 lb

## 2016-09-13 DIAGNOSIS — R1011 Right upper quadrant pain: Secondary | ICD-10-CM

## 2016-09-13 DIAGNOSIS — R1013 Epigastric pain: Secondary | ICD-10-CM

## 2016-09-13 DIAGNOSIS — R140 Abdominal distension (gaseous): Secondary | ICD-10-CM

## 2016-09-13 NOTE — Progress Notes (Signed)
Review of pertinent gastrointestinal problems: 1. Routine risk for colon cancer, screening colonoscopy Dr. Ardis Hughs 12 2016 found small hyperplastic polyp only. Recall recommended for colon cancer screening colonoscopy at 10 year interval. 2. Chronic abdominal pains; abdominal ultrasound 12 2016 was normal.  HIDA scan 12 2016 was also normal.    HPI: This is a  very pleasant 51 year old woman who is here today with I believe her husband and also a small service dog.  For 5 years bloating, tender to touch.  Just discomfort, nearly constant.  Really bloated.  Overall her weight   Up 10 pounds in 6 months.  Takes BCs 1-2 per day. Every day. Also takes oxycodone; for neck, arm pains.  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 Was going to start humira but she declined.  This is on her lower extremities.  TAkes omeprazole about twice per week.  She was taking ibuprofen 800mg  pills several months ago.  Chief complaint is chronic abdominal discomfort  ROS: complete GI ROS as described in HPI.  Constitutional:  No unintentional weight loss   Past Medical History:  Diagnosis Date  . ADD (attention deficit disorder)   . Anxiety disorder   . C. difficile colitis    induced by antibiotics  . COPD (chronic obstructive pulmonary disease) ([place])   . GERD (gastroesophageal reflux disease)   . HTN (hypertension)   . Plaque psoriasis   . Psoriatic arthritis [facility])     Past Surgical History:  Procedure Laterality Date  . CERVICAL FUSION    . CESAREAN SECTION     x 3  . ELBOW ARTHROSCOPY WITH TENDON RECONSTRUCTION Left   . TRIGGER FINGER RELEASE Bilateral    left thumb, right middle finger  . TUBAL LIGATION      Current Outpatient Prescriptions  Medication Sig Dispense Refill  . ALPRAZolam (XANAX) 1 MG tablet Take 1 mg by mouth 4 (four) times daily as needed for anxiety (panic attacks).    Marland Kitchen Apremilast (OTEZLA) 30 MG TABS Take by mouth daily.    . Aspirin-Salicylamide-Caffeine  (ARTHRITIS STRENGTH BC POWDER PO) Take by mouth.    . Cholecalciferol (VITAMIN D) 2000 units CAPS Take 2,000 Units by mouth daily.    Marland Kitchen omeprazole (PRILOSEC) 20 MG capsule Take 40 mg by mouth daily.     . ondansetron (ZOFRAN-ODT) 8 MG disintegrating tablet Takes 1-2 daily    . Oxycodone HCl 10 MG TABS as needed.     . triamcinolone cream (KENALOG) 0.1 % Apply 1 application topically daily as needed (itching).    . Turmeric, Curcuma Longa, (CURCUMIN EXTRACT) POWD Take by mouth daily.     . vitamin E (VITAMIN E) 1000 UNIT capsule Take 1,000 Units by mouth daily.    Marland Kitchen zolpidem (AMBIEN) 10 MG tablet Take 10 mg by mouth at bedtime as needed for sleep.     No current facility-administered medications for this visit.     Allergies as of 09/13/2016  . (No Known Allergies)    Family History  Problem Relation Age of Onset  . CAD Father   . Diabetes Father   . Throat cancer Maternal Grandmother   . COPD Mother   . Other Sister        bleeding on the brain  . Colon cancer Neg Hx     Social History   Social History  . Marital status: Married    Spouse name: N/A  . Number of children: 3  . Years of education: N/A  Occupational History  . disabled    Social History Main Topics  . Smoking status: Current Every Day Smoker    Packs/day: 0.50    Years: 37.00    Types: Cigarettes  . Smokeless tobacco: Never Used  . Alcohol use No     Comment: occ  . Drug use: No  . Sexual activity: Yes    Birth control/ protection: None   Other Topics Concern  . Not on file   Social History Narrative  . No narrative on file     Physical Exam: BP 118/80   Pulse 96   Ht 5' 0.25" (1.53 m)   Wt 139 lb (63 kg)   LMP 02/15/2013   BMI 26.92 kg/m  Constitutional: generally well-appearing Psychiatric: alert and oriented x3 Abdomen: soft, nontender, nondistended, no obvious ascites, no peritoneal signs, normal bowel sounds No peripheral edema noted in lower extremities  Assessment and  plan: 51 y.o. female with Chronic abdominal discomfort, bloating  She has had chronic abdominal discomforts for at least 5 years. CT scan, ultrasound, HIDA scan had shown no clear etiology. She takes BC powders at least once to twice a day and that certainly could be contributing. We discussed NSAIDs and their possible toxic effects on the GI system. She is going to completely stop NSAIDs. She is going to take her proton pump inhibitor daily rather than on a when necessary basis as she has been doing lately. I also recommended we proceed with upper endoscopy to rule out other potential etiologies of her discomfort, dyspepsia such as ulcer disease, neoplasm which I think is very unlikely, H. pylori.  Please see the "Patient Instructions" section for addition details about the plan.  Owens Loffler, MD [facility] 09/13/2016, 9:44 AM

## 2016-09-13 NOTE — Patient Instructions (Addendum)
You will be set up for an upper endoscopy.  Try to cut back on NSAIDs.  Labs:  (tTG, total IgA).  Prefers labs to be at [facility] labs.  Take prilosec every single day instead of once in a while. Best 20-30 minutes before a meal.

## 2016-09-23 ENCOUNTER — Encounter: Payer: Self-pay | Admitting: Gastroenterology

## 2016-09-24 ENCOUNTER — Telehealth: Payer: Self-pay | Admitting: Gastroenterology

## 2016-09-24 NOTE — Telephone Encounter (Signed)
Labs 09/19/2016: tTG normal, total IgA normal.  Please let her know the labs are all normal.  Should continue with the suggestions outlined at recent visit.

## 2016-09-24 NOTE — Telephone Encounter (Signed)
patient notified

## 2016-10-07 ENCOUNTER — Ambulatory Visit: Payer: Medicare Other | Admitting: Gastroenterology

## 2016-10-07 ENCOUNTER — Encounter: Payer: Self-pay | Admitting: Gastroenterology

## 2016-10-07 ENCOUNTER — Telehealth: Payer: Self-pay

## 2016-10-07 VITALS — BP 143/93 | HR 114 | Ht 60.0 in | Wt 139.0 lb

## 2016-10-07 MED ORDER — SODIUM CHLORIDE 0.9 % IV SOLN: 500.0000 mL | INTRAVENOUS | Status: AC

## 2016-10-07 NOTE — Telephone Encounter (Signed)
Left message on machine to call back  

## 2016-10-07 NOTE — Progress Notes (Signed)
I spoke with her and her husband. She has a temporary filling in left upper jaw that falls out every few days. She cannot pull it out.  I explained it is not safe to proceed for concern of dislodging the filling and causing aspiration, choking.  She asked for a toothpick to 'try to dig it out' so we can proceed once it is out. I am not comfortable with that idea.  She is going to contact her dentist about the issue.  We will contact her about rescheduling this non-emergent EGD.  She was not happy with the decision to cancel the procedure, however myself and my CRNA today agree with it.

## 2016-10-07 NOTE — Telephone Encounter (Signed)
-----   Message from Milus Banister, MD sent at 10/07/2016  3:15 PM EDT ----- Please call her to reschedule the procedure from today. Please try to aim for 3-4 weeks from now to allow time for her to get the filling in her mouth fixed so that it is not at significant risk of dislodging during an EGD.

## 2016-10-07 NOTE — Progress Notes (Signed)
Pt with temporary filling that is loose and has fallen out with teeth brushing. Genella Rife, CRNA notified as well as Dr. Ardis Hughs who met with patient and husband. Decision made not to complete procedure today. Office will contact patient to reschedule procedure.

## 2016-10-08 NOTE — Telephone Encounter (Signed)
Left message on machine to call back  

## 2016-10-09 NOTE — Telephone Encounter (Signed)
Unable to reach pt will mail letter  

## 2017-07-10 IMAGING — US US ABDOMEN COMPLETE
1 series · 14 of 25 positions shown · non-contrast
Comparison: CT 03/26/2015.Ultrasound 02/20/2014.

CLINICAL DATA: Right upper quadrant pain.

EXAM:
ABDOMEN ULTRASOUND COMPLETE

[Series 1: us abdomen complete · 0.19mm/px · 14 of 101 slices shown]
[im 1/101]
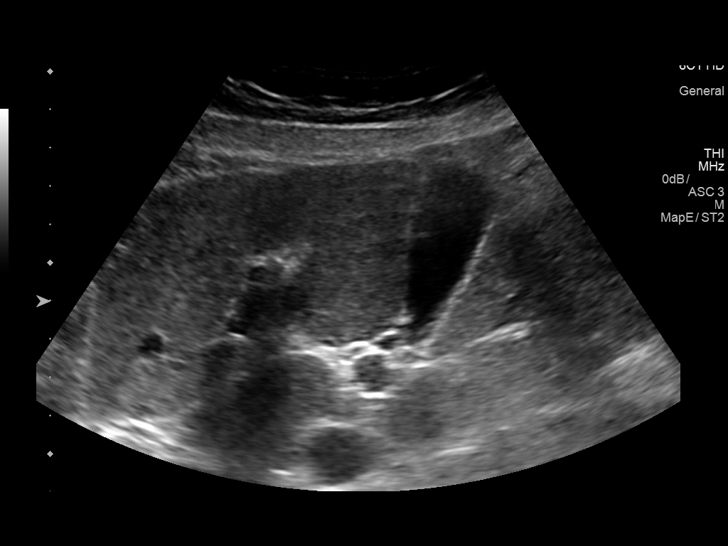
[im 9/101]
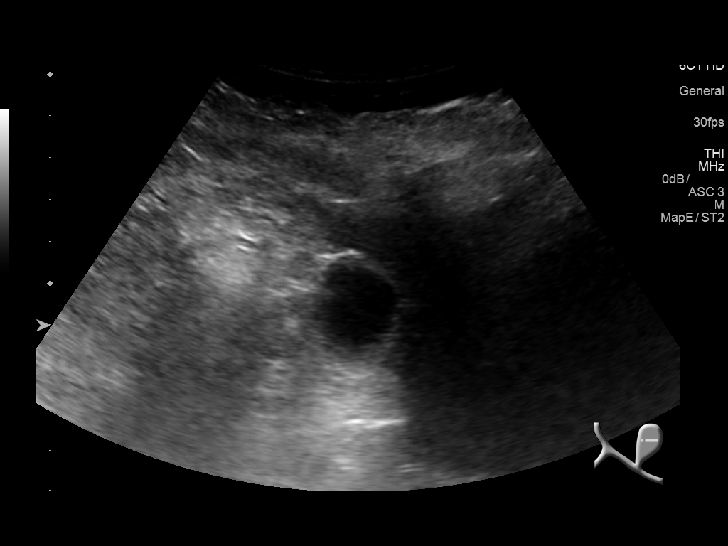
[im 17/101]
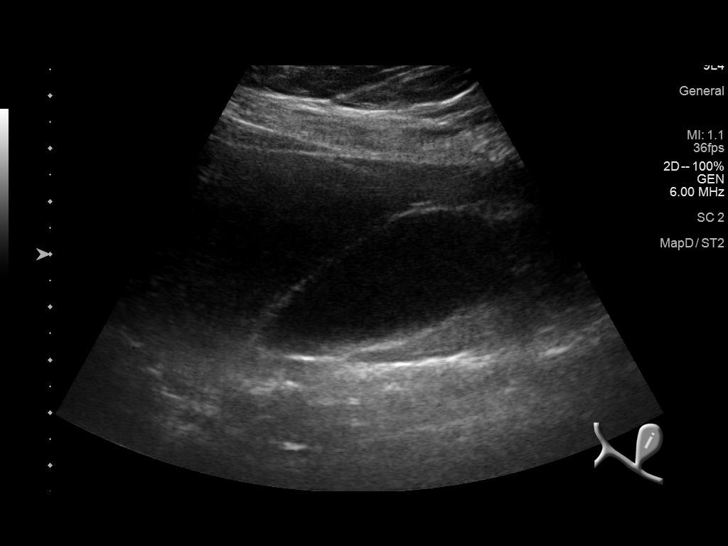
[im 26/101]
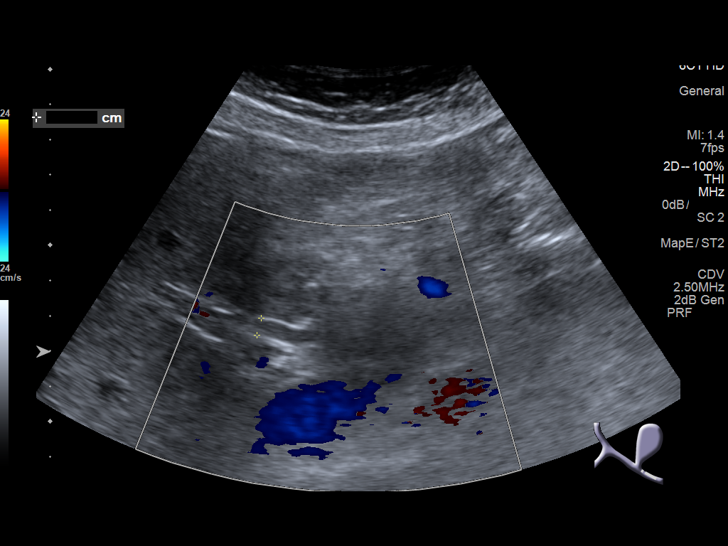
[im 34/101]
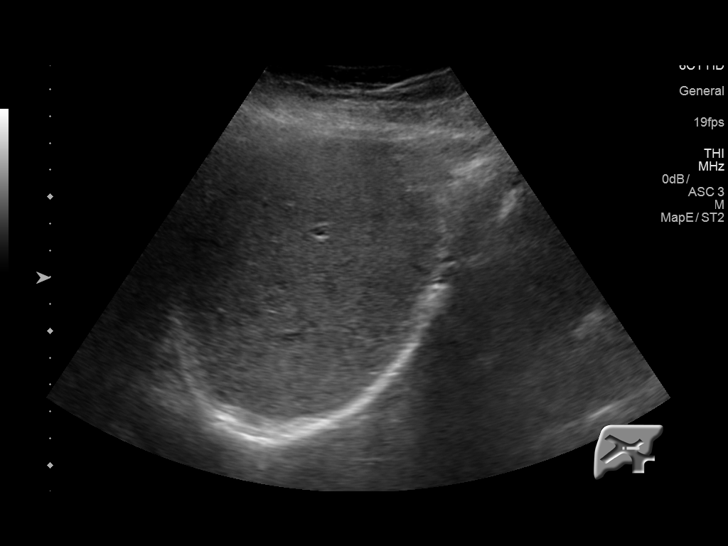
[im 38/101]
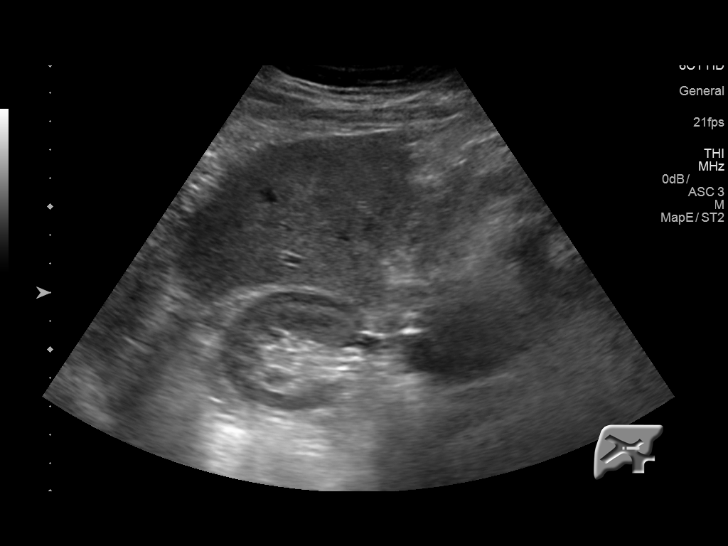
[im 46/101]
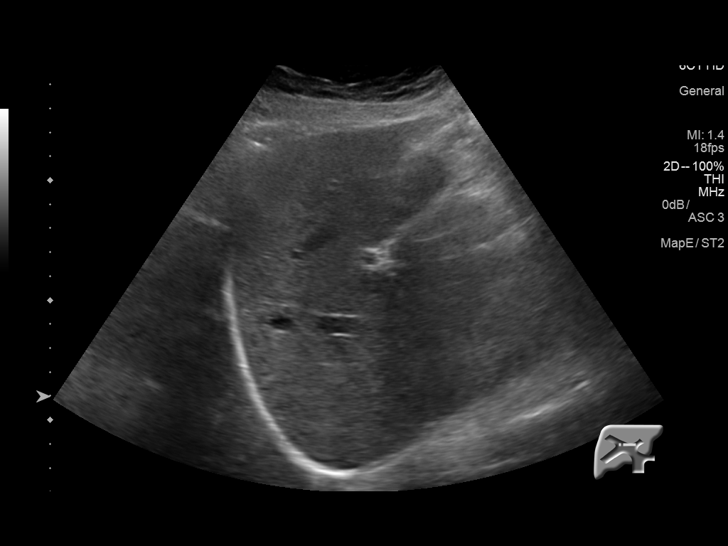
[im 55/101]
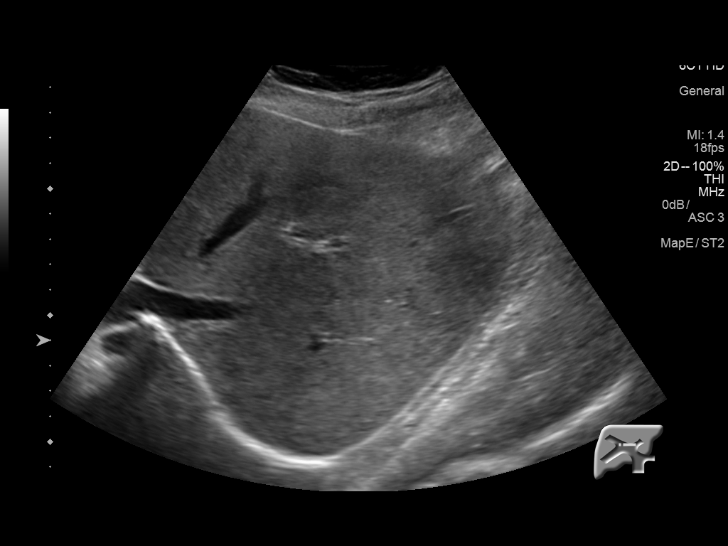
[im 63/101]
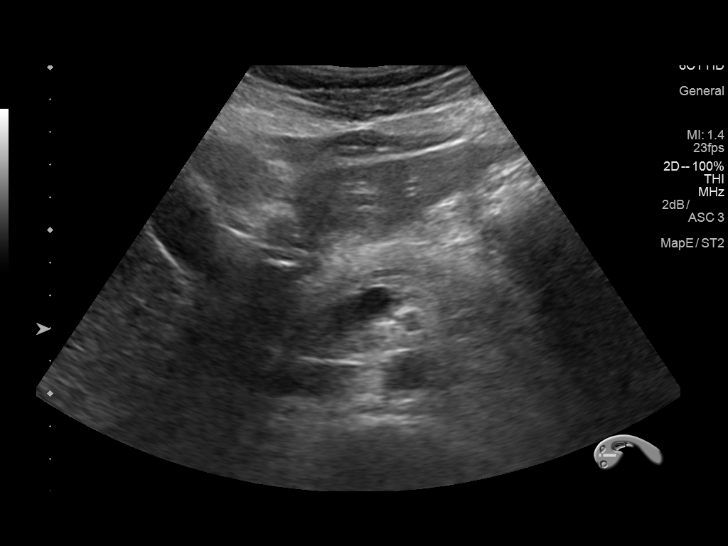
[im 67/101]
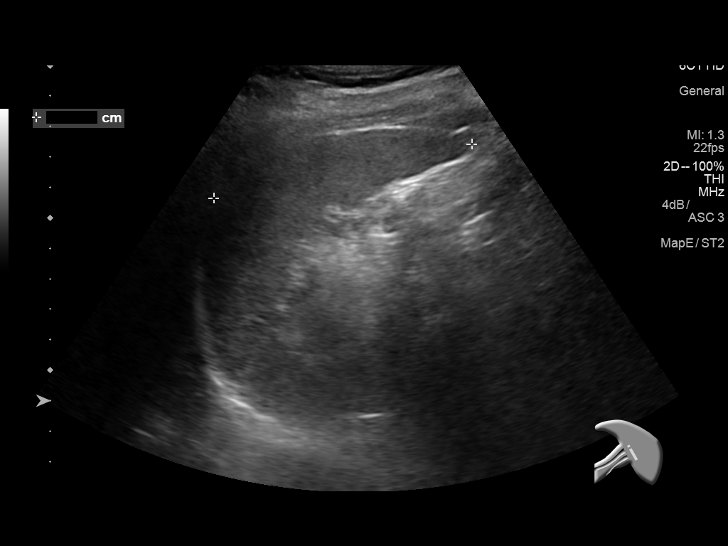
[im 76/101]
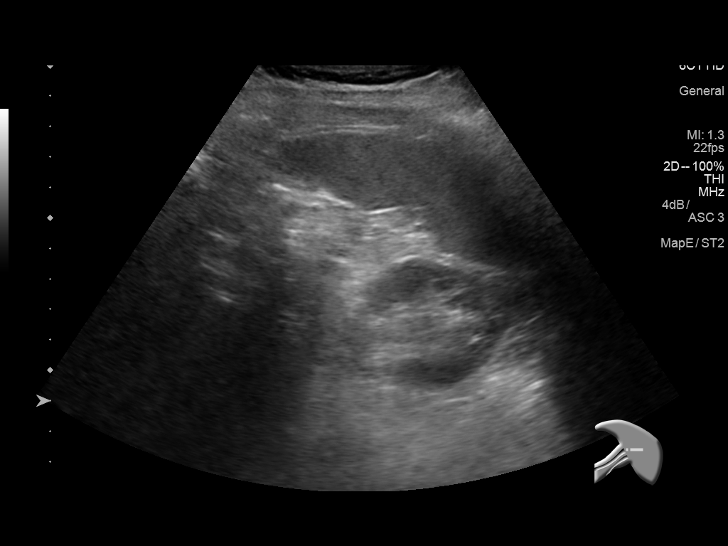
[im 84/101]
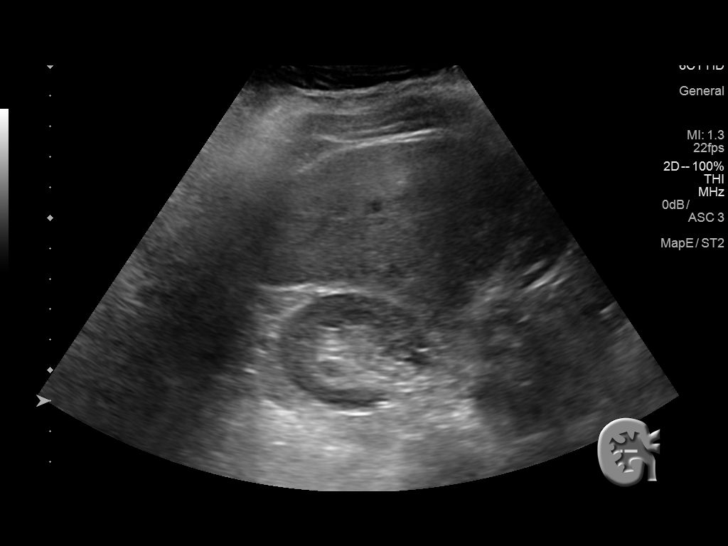
[im 92/101]
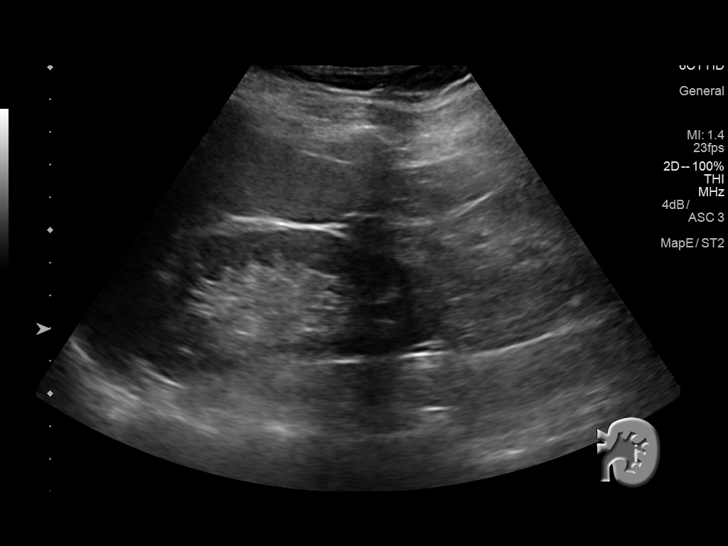
[im 101/101]
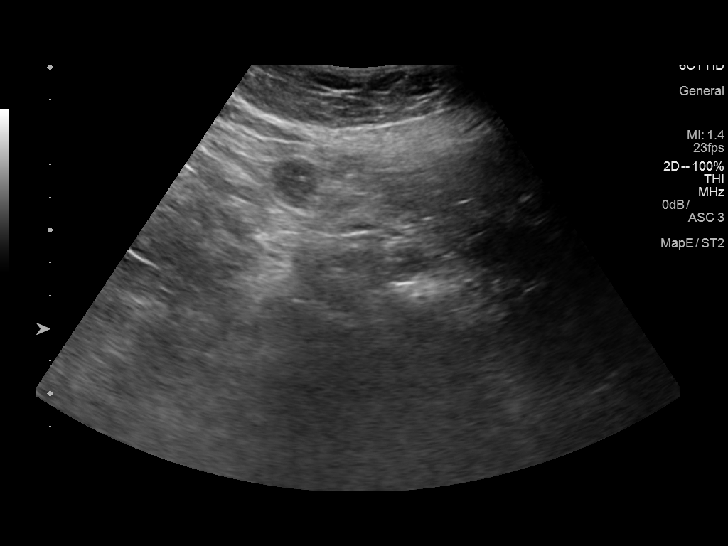

[14 of 25 positions shown; findings below may reference images not displayed]

FINDINGS: Gallbladder: No gallstones. Gallbladder wall thickness 1.5 mm.
Negative Murphy sign.

Common bile duct: Diameter: 5.0 mm

Liver: No focal lesion identified. Within normal limits in
parenchymal echogenicity.

IVC: No abnormality visualized.

Pancreas: Visualized portion unremarkable.

Spleen: Size and appearance within normal limits.

Right Kidney: Length: 10.6 cm. Echogenicity within normal limits. No
mass or hydronephrosis visualized.

Left Kidney: Length: 10.2 cm. Echogenicity within normal limits. No
mass or hydronephrosis visualized.

Abdominal aorta: No aneurysm visualized.

Other findings: None.
IMPRESSION: Negative exam .

## 2020-12-11 DIAGNOSIS — Z79891 Long term (current) use of opiate analgesic: Secondary | ICD-10-CM | POA: Diagnosis not present

## 2021-02-09 DIAGNOSIS — R051 Acute cough: Secondary | ICD-10-CM | POA: Diagnosis not present

## 2021-02-09 DIAGNOSIS — J209 Acute bronchitis, unspecified: Secondary | ICD-10-CM | POA: Diagnosis not present

## 2021-03-21 DIAGNOSIS — J0100 Acute maxillary sinusitis, unspecified: Secondary | ICD-10-CM | POA: Diagnosis not present

## 2021-03-21 DIAGNOSIS — R519 Headache, unspecified: Secondary | ICD-10-CM | POA: Diagnosis not present

## 2021-03-21 DIAGNOSIS — R051 Acute cough: Secondary | ICD-10-CM | POA: Diagnosis not present

## 2021-03-21 DIAGNOSIS — R0981 Nasal congestion: Secondary | ICD-10-CM | POA: Diagnosis not present

## 2021-03-21 DIAGNOSIS — R070 Pain in throat: Secondary | ICD-10-CM | POA: Diagnosis not present

## 2021-04-11 DIAGNOSIS — J069 Acute upper respiratory infection, unspecified: Secondary | ICD-10-CM | POA: Diagnosis not present

## 2021-04-11 DIAGNOSIS — R051 Acute cough: Secondary | ICD-10-CM | POA: Diagnosis not present

## 2021-06-06 DIAGNOSIS — Z79891 Long term (current) use of opiate analgesic: Secondary | ICD-10-CM | POA: Diagnosis not present

## 2021-06-25 DIAGNOSIS — R052 Subacute cough: Secondary | ICD-10-CM | POA: Diagnosis not present

## 2021-06-25 DIAGNOSIS — R0981 Nasal congestion: Secondary | ICD-10-CM | POA: Diagnosis not present

## 2021-06-25 DIAGNOSIS — L409 Psoriasis, unspecified: Secondary | ICD-10-CM | POA: Diagnosis not present

## 2021-09-18 DIAGNOSIS — R600 Localized edema: Secondary | ICD-10-CM | POA: Diagnosis not present

## 2021-09-18 DIAGNOSIS — N3000 Acute cystitis without hematuria: Secondary | ICD-10-CM | POA: Diagnosis not present

## 2021-09-18 DIAGNOSIS — R079 Chest pain, unspecified: Secondary | ICD-10-CM | POA: Diagnosis not present

## 2021-09-19 DIAGNOSIS — R079 Chest pain, unspecified: Secondary | ICD-10-CM | POA: Diagnosis not present

## 2021-11-20 DIAGNOSIS — S63502A Unspecified sprain of left wrist, initial encounter: Secondary | ICD-10-CM | POA: Diagnosis not present

## 2021-11-20 DIAGNOSIS — W19XXXA Unspecified fall, initial encounter: Secondary | ICD-10-CM | POA: Diagnosis not present

## 2021-11-22 DIAGNOSIS — M25532 Pain in left wrist: Secondary | ICD-10-CM | POA: Diagnosis not present

## 2021-12-03 DIAGNOSIS — S52572A Other intraarticular fracture of lower end of left radius, initial encounter for closed fracture: Secondary | ICD-10-CM | POA: Diagnosis not present

## 2022-01-02 DIAGNOSIS — L578 Other skin changes due to chronic exposure to nonionizing radiation: Secondary | ICD-10-CM | POA: Diagnosis not present

## 2022-01-02 DIAGNOSIS — L400 Psoriasis vulgaris: Secondary | ICD-10-CM | POA: Diagnosis not present

## 2022-01-02 DIAGNOSIS — L4050 Arthropathic psoriasis, unspecified: Secondary | ICD-10-CM | POA: Diagnosis not present

## 2022-01-02 DIAGNOSIS — L820 Inflamed seborrheic keratosis: Secondary | ICD-10-CM | POA: Diagnosis not present

## 2022-01-02 DIAGNOSIS — L299 Pruritus, unspecified: Secondary | ICD-10-CM | POA: Diagnosis not present

## 2022-02-05 DIAGNOSIS — J449 Chronic obstructive pulmonary disease, unspecified: Secondary | ICD-10-CM | POA: Diagnosis not present

## 2022-02-05 DIAGNOSIS — R0981 Nasal congestion: Secondary | ICD-10-CM | POA: Diagnosis not present

## 2022-02-05 DIAGNOSIS — R051 Acute cough: Secondary | ICD-10-CM | POA: Diagnosis not present

## 2022-02-05 DIAGNOSIS — M549 Dorsalgia, unspecified: Secondary | ICD-10-CM | POA: Diagnosis not present

## 2022-02-05 DIAGNOSIS — M5430 Sciatica, unspecified side: Secondary | ICD-10-CM | POA: Diagnosis not present

## 2022-04-03 DIAGNOSIS — R5383 Other fatigue: Secondary | ICD-10-CM | POA: Diagnosis not present

## 2022-04-03 DIAGNOSIS — M159 Polyosteoarthritis, unspecified: Secondary | ICD-10-CM | POA: Diagnosis not present

## 2022-04-03 DIAGNOSIS — L4050 Arthropathic psoriasis, unspecified: Secondary | ICD-10-CM | POA: Diagnosis not present

## 2022-04-03 DIAGNOSIS — L299 Pruritus, unspecified: Secondary | ICD-10-CM | POA: Diagnosis not present

## 2022-04-03 DIAGNOSIS — R030 Elevated blood-pressure reading, without diagnosis of hypertension: Secondary | ICD-10-CM | POA: Diagnosis not present

## 2022-04-03 DIAGNOSIS — Z6824 Body mass index (BMI) 24.0-24.9, adult: Secondary | ICD-10-CM | POA: Diagnosis not present

## 2022-04-03 DIAGNOSIS — E785 Hyperlipidemia, unspecified: Secondary | ICD-10-CM | POA: Diagnosis not present

## 2022-04-03 DIAGNOSIS — D649 Anemia, unspecified: Secondary | ICD-10-CM | POA: Diagnosis not present

## 2022-04-03 DIAGNOSIS — R110 Nausea: Secondary | ICD-10-CM | POA: Diagnosis not present

## 2022-04-03 DIAGNOSIS — L820 Inflamed seborrheic keratosis: Secondary | ICD-10-CM | POA: Diagnosis not present

## 2022-04-03 DIAGNOSIS — L400 Psoriasis vulgaris: Secondary | ICD-10-CM | POA: Diagnosis not present

## 2022-04-19 DIAGNOSIS — L4050 Arthropathic psoriasis, unspecified: Secondary | ICD-10-CM | POA: Diagnosis not present

## 2022-04-19 DIAGNOSIS — R519 Headache, unspecified: Secondary | ICD-10-CM | POA: Diagnosis not present

## 2022-04-19 DIAGNOSIS — D649 Anemia, unspecified: Secondary | ICD-10-CM | POA: Diagnosis not present

## 2022-04-19 DIAGNOSIS — Z6824 Body mass index (BMI) 24.0-24.9, adult: Secondary | ICD-10-CM | POA: Diagnosis not present

## 2022-04-19 DIAGNOSIS — M159 Polyosteoarthritis, unspecified: Secondary | ICD-10-CM | POA: Diagnosis not present

## 2022-04-19 DIAGNOSIS — I10 Essential (primary) hypertension: Secondary | ICD-10-CM | POA: Diagnosis not present

## 2022-04-19 DIAGNOSIS — E785 Hyperlipidemia, unspecified: Secondary | ICD-10-CM | POA: Diagnosis not present

## 2022-04-19 DIAGNOSIS — J069 Acute upper respiratory infection, unspecified: Secondary | ICD-10-CM | POA: Diagnosis not present

## 2022-04-19 DIAGNOSIS — R110 Nausea: Secondary | ICD-10-CM | POA: Diagnosis not present

## 2022-04-19 DIAGNOSIS — R739 Hyperglycemia, unspecified: Secondary | ICD-10-CM | POA: Diagnosis not present

## 2022-05-03 DIAGNOSIS — I10 Essential (primary) hypertension: Secondary | ICD-10-CM | POA: Diagnosis not present

## 2022-05-03 DIAGNOSIS — E785 Hyperlipidemia, unspecified: Secondary | ICD-10-CM | POA: Diagnosis not present

## 2022-05-03 DIAGNOSIS — D649 Anemia, unspecified: Secondary | ICD-10-CM | POA: Diagnosis not present

## 2022-05-03 DIAGNOSIS — L4050 Arthropathic psoriasis, unspecified: Secondary | ICD-10-CM | POA: Diagnosis not present

## 2022-05-03 DIAGNOSIS — R739 Hyperglycemia, unspecified: Secondary | ICD-10-CM | POA: Diagnosis not present

## 2022-05-03 DIAGNOSIS — R519 Headache, unspecified: Secondary | ICD-10-CM | POA: Diagnosis not present

## 2022-05-03 DIAGNOSIS — R110 Nausea: Secondary | ICD-10-CM | POA: Diagnosis not present

## 2022-05-03 DIAGNOSIS — M159 Polyosteoarthritis, unspecified: Secondary | ICD-10-CM | POA: Diagnosis not present

## 2022-05-03 DIAGNOSIS — Z6824 Body mass index (BMI) 24.0-24.9, adult: Secondary | ICD-10-CM | POA: Diagnosis not present

## 2022-05-31 DIAGNOSIS — I10 Essential (primary) hypertension: Secondary | ICD-10-CM | POA: Diagnosis not present

## 2022-05-31 DIAGNOSIS — L4050 Arthropathic psoriasis, unspecified: Secondary | ICD-10-CM | POA: Diagnosis not present

## 2022-05-31 DIAGNOSIS — R519 Headache, unspecified: Secondary | ICD-10-CM | POA: Diagnosis not present

## 2022-05-31 DIAGNOSIS — D649 Anemia, unspecified: Secondary | ICD-10-CM | POA: Diagnosis not present

## 2022-05-31 DIAGNOSIS — E785 Hyperlipidemia, unspecified: Secondary | ICD-10-CM | POA: Diagnosis not present

## 2022-05-31 DIAGNOSIS — J069 Acute upper respiratory infection, unspecified: Secondary | ICD-10-CM | POA: Diagnosis not present

## 2022-05-31 DIAGNOSIS — R739 Hyperglycemia, unspecified: Secondary | ICD-10-CM | POA: Diagnosis not present

## 2022-05-31 DIAGNOSIS — M159 Polyosteoarthritis, unspecified: Secondary | ICD-10-CM | POA: Diagnosis not present

## 2022-05-31 DIAGNOSIS — R110 Nausea: Secondary | ICD-10-CM | POA: Diagnosis not present

## 2022-05-31 DIAGNOSIS — Z6823 Body mass index (BMI) 23.0-23.9, adult: Secondary | ICD-10-CM | POA: Diagnosis not present

## 2022-06-07 DIAGNOSIS — L4050 Arthropathic psoriasis, unspecified: Secondary | ICD-10-CM | POA: Diagnosis not present

## 2022-06-07 DIAGNOSIS — M159 Polyosteoarthritis, unspecified: Secondary | ICD-10-CM | POA: Diagnosis not present

## 2022-06-07 DIAGNOSIS — R110 Nausea: Secondary | ICD-10-CM | POA: Diagnosis not present

## 2022-06-07 DIAGNOSIS — I10 Essential (primary) hypertension: Secondary | ICD-10-CM | POA: Diagnosis not present

## 2022-06-07 DIAGNOSIS — R519 Headache, unspecified: Secondary | ICD-10-CM | POA: Diagnosis not present

## 2022-06-07 DIAGNOSIS — H1031 Unspecified acute conjunctivitis, right eye: Secondary | ICD-10-CM | POA: Diagnosis not present

## 2022-06-07 DIAGNOSIS — Z6823 Body mass index (BMI) 23.0-23.9, adult: Secondary | ICD-10-CM | POA: Diagnosis not present

## 2022-06-07 DIAGNOSIS — R739 Hyperglycemia, unspecified: Secondary | ICD-10-CM | POA: Diagnosis not present

## 2022-06-07 DIAGNOSIS — D649 Anemia, unspecified: Secondary | ICD-10-CM | POA: Diagnosis not present

## 2022-06-07 DIAGNOSIS — E785 Hyperlipidemia, unspecified: Secondary | ICD-10-CM | POA: Diagnosis not present

## 2022-06-21 DIAGNOSIS — R519 Headache, unspecified: Secondary | ICD-10-CM | POA: Diagnosis not present

## 2022-06-21 DIAGNOSIS — Z6824 Body mass index (BMI) 24.0-24.9, adult: Secondary | ICD-10-CM | POA: Diagnosis not present

## 2022-06-21 DIAGNOSIS — M159 Polyosteoarthritis, unspecified: Secondary | ICD-10-CM | POA: Diagnosis not present

## 2022-06-21 DIAGNOSIS — R739 Hyperglycemia, unspecified: Secondary | ICD-10-CM | POA: Diagnosis not present

## 2022-06-21 DIAGNOSIS — D649 Anemia, unspecified: Secondary | ICD-10-CM | POA: Diagnosis not present

## 2022-06-21 DIAGNOSIS — I10 Essential (primary) hypertension: Secondary | ICD-10-CM | POA: Diagnosis not present

## 2022-06-21 DIAGNOSIS — E785 Hyperlipidemia, unspecified: Secondary | ICD-10-CM | POA: Diagnosis not present

## 2022-06-21 DIAGNOSIS — L4050 Arthropathic psoriasis, unspecified: Secondary | ICD-10-CM | POA: Diagnosis not present

## 2022-06-21 DIAGNOSIS — R110 Nausea: Secondary | ICD-10-CM | POA: Diagnosis not present

## 2022-07-05 DIAGNOSIS — Z6824 Body mass index (BMI) 24.0-24.9, adult: Secondary | ICD-10-CM | POA: Diagnosis not present

## 2022-07-05 DIAGNOSIS — R519 Headache, unspecified: Secondary | ICD-10-CM | POA: Diagnosis not present

## 2022-07-05 DIAGNOSIS — Z0000 Encounter for general adult medical examination without abnormal findings: Secondary | ICD-10-CM | POA: Diagnosis not present

## 2022-07-05 DIAGNOSIS — E785 Hyperlipidemia, unspecified: Secondary | ICD-10-CM | POA: Diagnosis not present

## 2022-07-05 DIAGNOSIS — R739 Hyperglycemia, unspecified: Secondary | ICD-10-CM | POA: Diagnosis not present

## 2022-07-05 DIAGNOSIS — D649 Anemia, unspecified: Secondary | ICD-10-CM | POA: Diagnosis not present

## 2022-07-05 DIAGNOSIS — R110 Nausea: Secondary | ICD-10-CM | POA: Diagnosis not present

## 2022-07-05 DIAGNOSIS — L4050 Arthropathic psoriasis, unspecified: Secondary | ICD-10-CM | POA: Diagnosis not present

## 2022-07-05 DIAGNOSIS — M159 Polyosteoarthritis, unspecified: Secondary | ICD-10-CM | POA: Diagnosis not present

## 2022-07-05 DIAGNOSIS — I10 Essential (primary) hypertension: Secondary | ICD-10-CM | POA: Diagnosis not present

## 2022-07-15 DIAGNOSIS — X58XXXA Exposure to other specified factors, initial encounter: Secondary | ICD-10-CM | POA: Diagnosis not present

## 2022-07-15 DIAGNOSIS — G44209 Tension-type headache, unspecified, not intractable: Secondary | ICD-10-CM | POA: Diagnosis not present

## 2022-07-15 DIAGNOSIS — R519 Headache, unspecified: Secondary | ICD-10-CM | POA: Diagnosis not present

## 2022-07-15 DIAGNOSIS — S00412A Abrasion of left ear, initial encounter: Secondary | ICD-10-CM | POA: Diagnosis not present

## 2022-07-22 ENCOUNTER — Telehealth: Payer: Self-pay

## 2022-07-22 NOTE — Telephone Encounter (Signed)
        Patient  visited [place] on 3/11    Telephone encounter attempt :  1st  A HIPAA compliant voice message was left requesting a return call.  Instructed patient to call back .    [facility] [phone] [address], [place],  [address] Phone: [phone] Email: [email or web address]

## 2022-08-02 DIAGNOSIS — R110 Nausea: Secondary | ICD-10-CM | POA: Diagnosis not present

## 2022-08-02 DIAGNOSIS — I10 Essential (primary) hypertension: Secondary | ICD-10-CM | POA: Diagnosis not present

## 2022-08-02 DIAGNOSIS — R519 Headache, unspecified: Secondary | ICD-10-CM | POA: Diagnosis not present

## 2022-08-02 DIAGNOSIS — M159 Polyosteoarthritis, unspecified: Secondary | ICD-10-CM | POA: Diagnosis not present

## 2022-08-02 DIAGNOSIS — R739 Hyperglycemia, unspecified: Secondary | ICD-10-CM | POA: Diagnosis not present

## 2022-08-02 DIAGNOSIS — L4050 Arthropathic psoriasis, unspecified: Secondary | ICD-10-CM | POA: Diagnosis not present

## 2022-08-02 DIAGNOSIS — Z6824 Body mass index (BMI) 24.0-24.9, adult: Secondary | ICD-10-CM | POA: Diagnosis not present

## 2022-08-02 DIAGNOSIS — D649 Anemia, unspecified: Secondary | ICD-10-CM | POA: Diagnosis not present

## 2022-08-02 DIAGNOSIS — E785 Hyperlipidemia, unspecified: Secondary | ICD-10-CM | POA: Diagnosis not present

## 2022-08-16 DIAGNOSIS — L4050 Arthropathic psoriasis, unspecified: Secondary | ICD-10-CM | POA: Diagnosis not present

## 2022-08-16 DIAGNOSIS — I10 Essential (primary) hypertension: Secondary | ICD-10-CM | POA: Diagnosis not present

## 2022-08-16 DIAGNOSIS — E785 Hyperlipidemia, unspecified: Secondary | ICD-10-CM | POA: Diagnosis not present

## 2022-08-16 DIAGNOSIS — Z6824 Body mass index (BMI) 24.0-24.9, adult: Secondary | ICD-10-CM | POA: Diagnosis not present

## 2022-08-16 DIAGNOSIS — R519 Headache, unspecified: Secondary | ICD-10-CM | POA: Diagnosis not present

## 2022-08-16 DIAGNOSIS — M159 Polyosteoarthritis, unspecified: Secondary | ICD-10-CM | POA: Diagnosis not present

## 2022-08-16 DIAGNOSIS — R739 Hyperglycemia, unspecified: Secondary | ICD-10-CM | POA: Diagnosis not present

## 2022-08-16 DIAGNOSIS — D649 Anemia, unspecified: Secondary | ICD-10-CM | POA: Diagnosis not present

## 2022-08-16 DIAGNOSIS — R110 Nausea: Secondary | ICD-10-CM | POA: Diagnosis not present

## 2022-08-30 DIAGNOSIS — I10 Essential (primary) hypertension: Secondary | ICD-10-CM | POA: Diagnosis not present

## 2022-08-30 DIAGNOSIS — E785 Hyperlipidemia, unspecified: Secondary | ICD-10-CM | POA: Diagnosis not present

## 2022-08-30 DIAGNOSIS — L4050 Arthropathic psoriasis, unspecified: Secondary | ICD-10-CM | POA: Diagnosis not present

## 2022-08-30 DIAGNOSIS — M159 Polyosteoarthritis, unspecified: Secondary | ICD-10-CM | POA: Diagnosis not present

## 2022-08-30 DIAGNOSIS — R519 Headache, unspecified: Secondary | ICD-10-CM | POA: Diagnosis not present

## 2022-08-30 DIAGNOSIS — Z6823 Body mass index (BMI) 23.0-23.9, adult: Secondary | ICD-10-CM | POA: Diagnosis not present

## 2022-08-30 DIAGNOSIS — D649 Anemia, unspecified: Secondary | ICD-10-CM | POA: Diagnosis not present

## 2022-08-30 DIAGNOSIS — R110 Nausea: Secondary | ICD-10-CM | POA: Diagnosis not present

## 2022-08-30 DIAGNOSIS — R739 Hyperglycemia, unspecified: Secondary | ICD-10-CM | POA: Diagnosis not present
